# Patient Record
Sex: Female | Born: 1935
Health system: Southern US, Community
[De-identification: ages and names within clinical notes are randomized; demographics above are authoritative.]

## PROBLEM LIST (undated history)

## (undated) DIAGNOSIS — I10 Essential (primary) hypertension: Secondary | ICD-10-CM

## (undated) DIAGNOSIS — G629 Polyneuropathy, unspecified: Secondary | ICD-10-CM

## (undated) DIAGNOSIS — H3530 Unspecified macular degeneration: Secondary | ICD-10-CM

## (undated) DIAGNOSIS — D0512 Intraductal carcinoma in situ of left breast: Secondary | ICD-10-CM

## (undated) DIAGNOSIS — C50919 Malignant neoplasm of unspecified site of unspecified female breast: Secondary | ICD-10-CM

## (undated) DIAGNOSIS — Z872 Personal history of diseases of the skin and subcutaneous tissue: Secondary | ICD-10-CM

## (undated) DIAGNOSIS — T884XXA Failed or difficult intubation, initial encounter: Secondary | ICD-10-CM

## (undated) DIAGNOSIS — E039 Hypothyroidism, unspecified: Secondary | ICD-10-CM

## (undated) HISTORY — DX: Unspecified macular degeneration: H35.30

## (undated) HISTORY — PX: CATARACT EXTRACTION W/ INTRAOCULAR LENS  IMPLANT, BILATERAL: SHX1307

## (undated) HISTORY — PX: PARS PLANA VITRECTOMY W/ REPAIR OF MACULAR HOLE: SHX2170

## (undated) HISTORY — DX: Essential (primary) hypertension: I10

## (undated) HISTORY — DX: Polyneuropathy, unspecified: G62.9

## (undated) HISTORY — PX: BREAST LUMPECTOMY: SHX2

## (undated) HISTORY — PX: ABDOMINAL HYSTERECTOMY: SHX81

---
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
## 1972-02-08 HISTORY — PX: EXCISION MORTON'S NEUROMA: SHX5013

## 1997-06-05 ENCOUNTER — Ambulatory Visit: Admission: RE | Admit: 1997-06-05 | Discharge: 1997-06-05 | Payer: Self-pay | Admitting: Internal Medicine

## 1998-05-28 ENCOUNTER — Ambulatory Visit (HOSPITAL_COMMUNITY): Admission: RE | Admit: 1998-05-28 | Discharge: 1998-05-28 | Payer: Self-pay | Admitting: Internal Medicine

## 1998-05-28 ENCOUNTER — Encounter: Payer: Self-pay | Admitting: Internal Medicine

## 1998-06-08 ENCOUNTER — Ambulatory Visit (HOSPITAL_COMMUNITY): Admission: RE | Admit: 1998-06-08 | Discharge: 1998-06-08 | Payer: Self-pay | Admitting: Family Medicine

## 1998-11-30 ENCOUNTER — Other Ambulatory Visit: Admission: RE | Admit: 1998-11-30 | Discharge: 1998-11-30 | Payer: Self-pay | Admitting: Internal Medicine

## 1999-05-31 ENCOUNTER — Ambulatory Visit (HOSPITAL_COMMUNITY): Admission: RE | Admit: 1999-05-31 | Discharge: 1999-05-31 | Payer: Self-pay | Admitting: Internal Medicine

## 1999-05-31 ENCOUNTER — Encounter: Payer: Self-pay | Admitting: Internal Medicine

## 1999-12-14 ENCOUNTER — Other Ambulatory Visit: Admission: RE | Admit: 1999-12-14 | Discharge: 1999-12-14 | Payer: Self-pay | Admitting: Internal Medicine

## 2000-03-14 ENCOUNTER — Encounter: Admission: RE | Admit: 2000-03-14 | Discharge: 2000-04-24 | Payer: Self-pay | Admitting: Orthopaedic Surgery

## 2000-06-05 ENCOUNTER — Encounter: Payer: Self-pay | Admitting: Internal Medicine

## 2000-06-05 ENCOUNTER — Ambulatory Visit (HOSPITAL_COMMUNITY): Admission: RE | Admit: 2000-06-05 | Discharge: 2000-06-05 | Payer: Self-pay | Admitting: Internal Medicine

## 2001-06-07 ENCOUNTER — Encounter: Payer: Self-pay | Admitting: Internal Medicine

## 2001-06-07 ENCOUNTER — Ambulatory Visit (HOSPITAL_COMMUNITY): Admission: RE | Admit: 2001-06-07 | Discharge: 2001-06-07 | Payer: Self-pay | Admitting: Internal Medicine

## 2001-11-01 ENCOUNTER — Inpatient Hospital Stay (HOSPITAL_COMMUNITY): Admission: EM | Admit: 2001-11-01 | Discharge: 2001-11-08 | Payer: Self-pay | Admitting: Emergency Medicine

## 2001-11-01 ENCOUNTER — Encounter: Payer: Self-pay | Admitting: General Surgery

## 2001-11-01 ENCOUNTER — Encounter: Payer: Self-pay | Admitting: Internal Medicine

## 2001-11-02 ENCOUNTER — Encounter (INDEPENDENT_AMBULATORY_CARE_PROVIDER_SITE_OTHER): Payer: Self-pay | Admitting: *Deleted

## 2001-11-02 HISTORY — PX: CHOLECYSTECTOMY: SHX55

## 2002-06-13 ENCOUNTER — Encounter: Payer: Self-pay | Admitting: Internal Medicine

## 2002-06-13 ENCOUNTER — Ambulatory Visit (HOSPITAL_COMMUNITY): Admission: RE | Admit: 2002-06-13 | Discharge: 2002-06-13 | Payer: Self-pay | Admitting: Internal Medicine

## 2003-02-06 ENCOUNTER — Other Ambulatory Visit: Admission: RE | Admit: 2003-02-06 | Discharge: 2003-02-06 | Payer: Self-pay | Admitting: Internal Medicine

## 2003-06-18 ENCOUNTER — Ambulatory Visit (HOSPITAL_COMMUNITY): Admission: RE | Admit: 2003-06-18 | Discharge: 2003-06-18 | Payer: Self-pay | Admitting: Internal Medicine

## 2004-06-18 ENCOUNTER — Ambulatory Visit (HOSPITAL_COMMUNITY): Admission: RE | Admit: 2004-06-18 | Discharge: 2004-06-18 | Payer: Self-pay | Admitting: Internal Medicine

## 2005-06-20 ENCOUNTER — Ambulatory Visit (HOSPITAL_COMMUNITY): Admission: RE | Admit: 2005-06-20 | Discharge: 2005-06-20 | Payer: Self-pay | Admitting: Internal Medicine

## 2006-02-16 ENCOUNTER — Other Ambulatory Visit: Admission: RE | Admit: 2006-02-16 | Discharge: 2006-02-16 | Payer: Self-pay | Admitting: Internal Medicine

## 2006-04-10 ENCOUNTER — Encounter: Admission: RE | Admit: 2006-04-10 | Discharge: 2006-04-10 | Payer: Self-pay | Admitting: Neurology

## 2006-06-22 ENCOUNTER — Ambulatory Visit (HOSPITAL_COMMUNITY): Admission: RE | Admit: 2006-06-22 | Discharge: 2006-06-22 | Payer: Self-pay | Admitting: Internal Medicine

## 2007-03-19 ENCOUNTER — Encounter: Admission: RE | Admit: 2007-03-19 | Discharge: 2007-03-19 | Payer: Self-pay | Admitting: Internal Medicine

## 2007-04-12 ENCOUNTER — Encounter: Admission: RE | Admit: 2007-04-12 | Discharge: 2007-04-12 | Payer: Self-pay | Admitting: Internal Medicine

## 2007-06-26 ENCOUNTER — Ambulatory Visit (HOSPITAL_COMMUNITY): Admission: RE | Admit: 2007-06-26 | Discharge: 2007-06-26 | Payer: Self-pay | Admitting: Internal Medicine

## 2007-07-05 ENCOUNTER — Encounter: Admission: RE | Admit: 2007-07-05 | Discharge: 2007-07-05 | Payer: Self-pay | Admitting: Internal Medicine

## 2007-10-09 ENCOUNTER — Encounter: Admission: RE | Admit: 2007-10-09 | Discharge: 2007-10-09 | Payer: Self-pay | Admitting: Internal Medicine

## 2008-02-15 ENCOUNTER — Ambulatory Visit: Payer: Self-pay

## 2008-04-07 ENCOUNTER — Ambulatory Visit: Payer: Self-pay | Admitting: Internal Medicine

## 2008-04-07 ENCOUNTER — Encounter: Admission: RE | Admit: 2008-04-07 | Discharge: 2008-04-07 | Payer: Self-pay | Admitting: Internal Medicine

## 2008-06-26 ENCOUNTER — Ambulatory Visit (HOSPITAL_COMMUNITY): Admission: RE | Admit: 2008-06-26 | Discharge: 2008-06-26 | Payer: Self-pay | Admitting: Internal Medicine

## 2008-10-21 ENCOUNTER — Ambulatory Visit: Payer: Self-pay | Admitting: Internal Medicine

## 2009-01-06 ENCOUNTER — Ambulatory Visit (HOSPITAL_COMMUNITY): Admission: RE | Admit: 2009-01-06 | Discharge: 2009-01-06 | Payer: Self-pay | Admitting: Orthopaedic Surgery

## 2009-01-06 HISTORY — PX: KNEE ARTHROSCOPY: SHX127

## 2009-04-09 ENCOUNTER — Other Ambulatory Visit: Admission: RE | Admit: 2009-04-09 | Discharge: 2009-04-09 | Payer: Self-pay | Admitting: Internal Medicine

## 2009-04-09 ENCOUNTER — Ambulatory Visit: Payer: Self-pay | Admitting: Internal Medicine

## 2009-06-29 ENCOUNTER — Ambulatory Visit (HOSPITAL_COMMUNITY): Admission: RE | Admit: 2009-06-29 | Discharge: 2009-06-29 | Payer: Self-pay | Admitting: Internal Medicine

## 2009-06-29 LAB — HM MAMMOGRAPHY

## 2009-10-15 ENCOUNTER — Ambulatory Visit: Payer: Self-pay | Admitting: Internal Medicine

## 2010-02-28 ENCOUNTER — Encounter: Payer: Self-pay | Admitting: Internal Medicine

## 2010-04-15 ENCOUNTER — Ambulatory Visit (INDEPENDENT_AMBULATORY_CARE_PROVIDER_SITE_OTHER): Payer: Medicare Other | Admitting: Internal Medicine

## 2010-04-15 DIAGNOSIS — I10 Essential (primary) hypertension: Secondary | ICD-10-CM

## 2010-04-15 DIAGNOSIS — E039 Hypothyroidism, unspecified: Secondary | ICD-10-CM

## 2010-05-12 LAB — BASIC METABOLIC PANEL
BUN: 6 mg/dL (ref 6–23)
CO2: 29 mEq/L (ref 19–32)
GFR calc non Af Amer: >60 mL/min (ref >60)
Glucose, Bld: 92 mg/dL (ref 70–99)
Sodium: 140 mEq/L (ref 135–145)

## 2010-05-12 LAB — CBC
Hemoglobin: 14 g/dL (ref 12.0–15.0)
MCHC: 34.6 g/dL (ref 30.0–36.0)
MCV: 92.9 fL (ref 78.0–100.0)
Platelets: 193 10*3/uL (ref 150–400)
RBC: 4.34 MIL/uL (ref 3.87–5.11)
RDW: 13.1 % (ref 11.5–15.5)

## 2010-05-24 ENCOUNTER — Other Ambulatory Visit: Payer: Self-pay | Admitting: Internal Medicine

## 2010-05-24 DIAGNOSIS — Z1231 Encounter for screening mammogram for malignant neoplasm of breast: Secondary | ICD-10-CM
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
## 2010-06-25 NOTE — Discharge Summary (Signed)
   [name], Christine Frost                       ACCOUNT NO.:  [id number]   MEDICAL RECORD NO.:  [id number]                   PATIENT TYPE:  INP   LOCATION:  5150                                 FACILITY:  [facility]   PHYSICIAN:  Adolph Pollack, M.D.            DATE OF BIRTH:  16-Dec-1930   DATE OF ADMISSION:  11/01/2001  DATE OF DISCHARGE:  11/08/2001                                 DISCHARGE SUMMARY   PRINCIPAL DISCHARGE DIAGNOSIS:  Acute cholecystitis.   SECONDARY DIAGNOSIS:  Gallbladder empyema.   PROCEDURE:  Laparoscopic cholecystectomy.   REASON FOR ADMISSION:  The patient is a 77 year old female who 5 days prior  to admission had the onset of crampy epigastric pain that persisted and  worsened and became more associated with the right upper quadrant.  She had  some nausea, vomiting and chills and abdominal bloating.  Dr. Lenord Fellers saw her  in the office and palpated a right upper quadrant mass.  She subsequently  was admitted.   HOSPITAL COURSE:  She underwent a CT scan which was consistent with acute  cholecystitis and had elevation of her white blood cell count.  She was  hydrated, started on IV antibiotics and on 11/02/2001 taken to the operating  room where she had severe acute cholecystitis with a gallbladder empyema.  A  Blake drain was left in and she was maintained on IV Unasyn.  She did have a  postoperative ileus which slowly began improving.  She still had some low  grade fever and was kept on her intravenous Unasyn.  She was started on a  clear liquid diet and advanced to full liquid diet.  She began having bowel  movements on the fifth postoperative day and her fever was trending down and  her diet was advanced.  She subsequently became afebrile on the sixth  postoperative day and was tolerating a diet.  Her Jackson-Pratt drain was  removed as the output was not bilious and she was felt to be ready for  discharge.   DISPOSITION:  Discharged to home on 11/08/2001  in satisfactory condition.  She is to take Augmentin q.12h. and Tylenol as needed for pain.  She was  given specific instructions and will see me in about 2 weeks for follow-up.                                               Adolph Pollack, M.D.    Kari Baars  D:  12/11/2001  T:  12/12/2001  Job:  [id number]   cc:   Luanna Cole. Lenord Fellers, M.D.  [address] [place]  [place]  [place] [address]  Fax: [phone]

## 2010-06-25 NOTE — Consult Note (Signed)
NAME:  Christine Frost, Christine Frost                       ACCOUNT NO.:  [id number]   MEDICAL RECORD NO.:  [id number]                   PATIENT TYPE:  INP   LOCATION:  5150                                 FACILITY:  [facility]   PHYSICIAN:  Adolph Pollack, M.D.            DATE OF BIRTH:  04-Jul-1930   DATE OF CONSULTATION:  DATE OF DISCHARGE:                                   CONSULTATION   REASON FOR CONSULTATION:  Right upper quadrant pain, distention.   HISTORY OF PRESENT ILLNESS:  The patient is an otherwise healthy 77 year old  female who  was at the beach and had some hash browns and barbecue. After  that she began developing some epigastric and right upper quadrant abdominal  cramping that persisted. Her daughter states she looked quite ill Saturday  night. She had two episodes of vomiting but the pain persisted. She kept  thinking that this was going to get better. She noticed abdominal distention  and decrease of appetite. She finally went to see Dr. Lenord Fellers, who noted a  right upper quadrant mass. She was sent over to the hospital for admission.   She has been having  some chills, she tells me. She states she has never had  any type of abdominal pain before and has never had food intolerance before.   PAST MEDICAL HISTORY:  No chronic illnesses.   PAST SURGICAL HISTORY:  Transvaginal hysterectomy.   ALLERGIES:  No known allergies.   MEDICATIONS:  1. Multivitamin.  2. Calcium.  3. Vitamin C.   SOCIAL HISTORY:  No tobacco use. She is a retired Technical brewer. She  occasionally has an alcoholic beverage.   REVIEW OF SYSTEMS:  CARDIOVASCULAR:  She denies hypertension or heart  disease. GI:  She denies peptic ulcer disease. She denies any  diverticulitis. She denies any hepatitis or jaundice. She had a colonoscopy  by Dr. Matthias Hughs in March of this year which was unremarkable. GU:  No kidney  stones.  HEMATOLOGIC:  She has had a transfusion before. No reported blood  clots or bleeding  disorders.   PHYSICAL EXAMINATION:  GENERAL:  An ill appearing female with a temperature  of 102. No jaundice on the skin.  HEENT:  Eyes no icterus.  NECK:  Supple without palpable masses.  CARDIOVASCULAR:  Heart demonstrates an increased rate with a regular rhythm.  RESPIRATORY:  Breath sounds equal and clear.  ABDOMEN:  Shows a firm mass in the right upper quadrant area going down to  the right mid abdomen which is tender to palpitation. There is distention  noted. Active bowel sounds are noted as well.   LABORATORY DATA:  Remarkable for WBC count of 15,700, hemoglobin 13.1. Her  alkaline phosphatase is 159; the rest of her liver function tests are not  elevated. Her amylase and lipase are not elevated.   Review of CT scan shows gallbladder wall thickening with pericholecystic  inflammatory changes.   IMPRESSION:  Acute calculus cholecystitis. She has a significant right upper  quadrant inflammatory response, most likely the omentum encasing the  gallbladder.   PLAN:  1. Intravenous fluid hydration.  2. Intravenous antibiotics.  3. Laparoscopic possible open cholecystectomy on November 02, 2001. I did     explain the procedure and the risks to her. The risks including but not     limited to bleeding, infection,  thrombolic injury, bile leak, small     intestinal injury, the risk of anesthesia. We also talked about the fact     that the inflammation could be so significant that we cannot  identify     bile structures and she just may end up with a cholecystostomy tube. Both     she and her daughter seemed to understand this and agreed to proceed.                                               Adolph Pollack, M.D.    Kari Baars  D:  11/01/2001  T:  11/05/2001  Job:  [id number]   cc:   Luanna Cole. Lenord Fellers, M.D.   Florencia Reasons, M.D.  [address] [place]  [place], [place] [address]  Fax: [phone]

## 2010-06-25 NOTE — Op Note (Signed)
NAME:  Christine Frost, Christine Frost                       ACCOUNT NO.:  [id number]   MEDICAL RECORD NO.:  [id number]                   PATIENT TYPE:  INP   LOCATION:  5150                                 FACILITY:  [facility]   PHYSICIAN:  Adolph Pollack, M.D.            DATE OF BIRTH:  Sep 30, 1930   DATE OF PROCEDURE:  11/02/2001  DATE OF DISCHARGE:                                 OPERATIVE REPORT   PREOPERATIVE DIAGNOSIS:  Acute cholecystitis.   POSTOPERATIVE DIAGNOSIS:  Severe acute cholecystitis.   PROCEDURE:  Laparoscopic cholecystectomy.   SURGEON:  Adolph Pollack, M.D.   ASSISTANT:  Sheppard Plumber. Earlene Plater, M.D.   ANESTHESIA:  General.   INDICATIONS:  The patient is a 77 year old female who became ill after  eating hash browns and barbecue six days ago.  She stayed at home and  finally presented to Dr. Lenord Fellers, her primary care physician, who noted her  to be quite ill with fever and inability to keep oral intake.  She also had  a palpable right upper quadrant midabdominal mass.  She was subsequently  admitted to the hospital, where CT scan findings were consistent with severe  acute cholecystitis.  She also had leukocytosis.  She was hydrated  overnight, given intravenous antibiotics, and is now brought to the  operating room.  The procedure and the risks were discussed with her  preoperatively.   DESCRIPTION OF PROCEDURE:  She was placed supine upon the operating table,  and a general anesthetic was administered.  A Foley catheter was placed in  the bladder.  On examination, there was a palpable mass in the right upper  quadrant down to the umbilicus.  The abdomen was sterilely prepped and  draped.  Local anesthetic was infiltrated in the subumbilical region and a  small subumbilical incision made in the skin and subcutaneous tissue.  The  midline fascia was incised and the peritoneal cavity was entered under  direct vision.  A pursestring suture of 0 Vicryl was placed around the  fascial edges.  A Hasson trocar was introduced in the peritoneal cavity and  pneumoperitoneum created with insufflation of CO2 gas.  Next a laparoscope  was introduced, and what I saw was acute inflammatory reaction in the right  upper quadrant along with acute inflammation of the omentum.  I placed an 11  mm trocar in the epigastric region and two 5 mm trocars in the right lower  quadrant region.  I began to peel the omentum off the liver and then noticed  an inflamed gallbladder.  I was able to grasp the fundus of the gallbladder.  There was severe inflammation noted.  Using hydrodissection as well as  careful blunt dissection, I was able to dissect the gallbladder free from  the omentum first by peeling the omentum off the fundus and the body.  A  small puncture hole was made in the gallbladder while doing this, and pus  was evacuated and suctioned out, and irrigation was then performed.  Eventually the fundus of the gallbladder was able to be retracted up toward  the right shoulder.  I was then able to identify the infundibulum using  blunt dissection and keeping the dissection on the gallbladder.  There was a  stone impacted.  We then grasped the infundibulum and mobilized it  laterally.  I completely mobilized the infundibulum using blunt dissection  and was able to identify the cystic duct.  I created a window around the  cystic duct, which was fairly inflamed.  I then also identified a cystic  artery and created a window around it.  I clipped the cystic duct four times  proximally, as it was very thickened, and one time distally, and I divided  it.  I then clipped the cystic artery and divided it.  I was able to dissect  the gallbladder free from the liver bed with the cautery.  The large stone  was dislodged and fell out of the gallbladder, and this was retrieved.  Once  I had removed this from the liver, I placed the gallbladder in the Endopouch  bag.  I then copiously irrigated  out the gallbladder fossa and perihepatic  area with 3.5 L of saline.  I did see what appeared to be some accessory  ducts that may have been ducts of Luschka, and I went ahead and placed clips  on these.  I evacuated as much of the fluid as possible.  I subsequently re-  examined the gallbladder fossa and did not notice any bleeding or bile  leakage.  There was, however, a large raw surface.  I placed Surgicel in the  gallbladder fossa.  I then inserted a 19 Blake drain and placed it into the  gallbladder fossa and had it exit out the lateral 5 mm trocar site.  The  wound was anchored to the skin with 3-0 nylon suture.  I reinspected the  position of the drain, and it was adequately in the gallbladder fossa.   Next I examined the omental area and evacuated some of the blood that had  been clotted there.  We then tried to evacuate again as much fluid as  possible.  I then removed the subumbilical trocar and closed the  subumbilical fascial defect by tightening up and tying down the pursestring  suture under laparoscopic vision.  The remaining trocars were removed, and  the pneumoperitoneum was released.   The skin incisions were closed with 4-0 Monocryl subcuticular stitches.  Steri-Strips and sterile dressings were applied.   She tolerated the procedure well without any apparent complications and was  taken to the recovery room in satisfactory condition.  I did explain to her  daughter that because of the severe inflammation and infection that she may  be at slightly high risk of developing a postoperative abscess and we would  keep her on intravenous antibiotics over the weekend at least.                                               Adolph Pollack, M.D.    Kari Baars  D:  11/02/2001  T:  11/05/2001  Job:  [id number]   cc:   Luanna Cole. Lenord Fellers, M.D.

## 2010-07-01 ENCOUNTER — Ambulatory Visit (HOSPITAL_COMMUNITY)
Admission: RE | Admit: 2010-07-01 | Discharge: 2010-07-01 | Disposition: A | Discharge status: Home / Self Care | Payer: Medicare Other | Source: Ambulatory Visit | Attending: Internal Medicine | Admitting: Internal Medicine

## 2010-07-01 DIAGNOSIS — Z1231 Encounter for screening mammogram for malignant neoplasm of breast: Secondary | ICD-10-CM

## 2010-07-08 ENCOUNTER — Other Ambulatory Visit: Payer: Self-pay | Admitting: Internal Medicine

## 2010-10-15 ENCOUNTER — Encounter: Payer: Self-pay | Admitting: Internal Medicine

## 2010-10-18 ENCOUNTER — Encounter: Payer: Self-pay | Admitting: Internal Medicine

## 2010-10-18 ENCOUNTER — Ambulatory Visit (INDEPENDENT_AMBULATORY_CARE_PROVIDER_SITE_OTHER): Payer: Medicare Other | Admitting: Internal Medicine

## 2010-10-18 VITALS — BP 128/62 | HR 66 | Temp 97.6°F | Ht 62.0 in | Wt 122.0 lb

## 2010-10-18 DIAGNOSIS — E039 Hypothyroidism, unspecified: Secondary | ICD-10-CM | POA: Insufficient documentation

## 2010-10-18 DIAGNOSIS — G629 Polyneuropathy, unspecified: Secondary | ICD-10-CM

## 2010-10-18 DIAGNOSIS — E785 Hyperlipidemia, unspecified: Secondary | ICD-10-CM | POA: Insufficient documentation

## 2010-10-18 DIAGNOSIS — I10 Essential (primary) hypertension: Secondary | ICD-10-CM

## 2010-10-18 DIAGNOSIS — G609 Hereditary and idiopathic neuropathy, unspecified: Secondary | ICD-10-CM

## 2010-10-18 NOTE — Progress Notes (Signed)
  Subjective:    Patient ID: Christine Frost, female    DOB: 05-Nov-1930, 77 y.o.   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  Is intolerant of Augmentin causes a rash  Patient had mammogram may 2011, declines flu and Pneumovax immunizations. Last tetanus immunization 2007. Colonoscopy 2003 by Dr. Matthias Hughs. Pilonidal cystectomy 1953, vaginal hysterectomy without oophorectomy for menorrhagia in 1974 bilateral Morton's neuromas excised from feet 1974 left cataract extraction June 1998 left macular hole surgery July 1998; left macular hole reclosed November 1998, laparoscopic cholecystectomy September 2003.  Patient is a widow. Lives alone. Continues to drive.    Review of Systems     Objective:   Physical Exam no thyromegaly; no carotid bruits; chest clear; cardiac exam regular rate and rhythm normal S1 and S2; extremities without edema        Assessment & Plan:  Hypertension  Hyperlipidemia  Peripheral neuropathy of unknown cause seen by Dr.Love  Hypothyroidism  Patient says she feels well and doesn't see the need come every 6 months. Return in one year or as needed. Okay to refill Synthroid for one year. TSH drawn today

## 2010-10-19 ENCOUNTER — Encounter: Payer: Self-pay | Admitting: Internal Medicine

## 2011-01-24 ENCOUNTER — Other Ambulatory Visit: Payer: Self-pay | Admitting: Internal Medicine

## 2011-05-04 DIAGNOSIS — H35349 Macular cyst, hole, or pseudohole, unspecified eye: Secondary | ICD-10-CM | POA: Diagnosis not present

## 2011-05-12 DIAGNOSIS — H35349 Macular cyst, hole, or pseudohole, unspecified eye: Secondary | ICD-10-CM | POA: Diagnosis not present

## 2011-05-12 DIAGNOSIS — H40019 Open angle with borderline findings, low risk, unspecified eye: Secondary | ICD-10-CM | POA: Diagnosis not present

## 2011-05-12 DIAGNOSIS — H353190 Nonexudative age-related macular degeneration, unspecified eye, stage unspecified: Secondary | ICD-10-CM | POA: Diagnosis not present

## 2011-05-30 ENCOUNTER — Other Ambulatory Visit: Payer: Self-pay | Admitting: Internal Medicine

## 2011-05-30 DIAGNOSIS — Z1231 Encounter for screening mammogram for malignant neoplasm of breast: Secondary | ICD-10-CM

## 2011-06-01 ENCOUNTER — Other Ambulatory Visit: Payer: Self-pay

## 2011-06-01 MED ORDER — LEVOTHYROXINE SODIUM 50 MCG PO TABS: 50.0000 ug | ORAL_TABLET | Freq: Every day | ORAL | Status: DC

## 2011-07-07 ENCOUNTER — Ambulatory Visit (HOSPITAL_COMMUNITY)
Admission: RE | Admit: 2011-07-07 | Discharge: 2011-07-07 | Disposition: A | Discharge status: Home / Self Care | Payer: Medicare Other | Source: Ambulatory Visit | Attending: Internal Medicine | Admitting: Internal Medicine

## 2011-07-07 DIAGNOSIS — Z1231 Encounter for screening mammogram for malignant neoplasm of breast: Secondary | ICD-10-CM | POA: Insufficient documentation

## 2011-08-08 ENCOUNTER — Other Ambulatory Visit: Payer: Self-pay | Admitting: Internal Medicine

## 2011-08-31 ENCOUNTER — Other Ambulatory Visit: Payer: Self-pay | Admitting: Gastroenterology

## 2011-08-31 DIAGNOSIS — D126 Benign neoplasm of colon, unspecified: Secondary | ICD-10-CM | POA: Diagnosis not present

## 2011-08-31 DIAGNOSIS — K5730 Diverticulosis of large intestine without perforation or abscess without bleeding: Secondary | ICD-10-CM | POA: Diagnosis not present

## 2011-08-31 DIAGNOSIS — Z1211 Encounter for screening for malignant neoplasm of colon: Secondary | ICD-10-CM | POA: Diagnosis not present

## 2011-10-17 ENCOUNTER — Other Ambulatory Visit: Payer: Medicare Other | Admitting: Internal Medicine

## 2011-10-17 DIAGNOSIS — M810 Age-related osteoporosis without current pathological fracture: Secondary | ICD-10-CM

## 2011-10-17 DIAGNOSIS — M1990 Unspecified osteoarthritis, unspecified site: Secondary | ICD-10-CM | POA: Diagnosis not present

## 2011-10-17 DIAGNOSIS — E039 Hypothyroidism, unspecified: Secondary | ICD-10-CM

## 2011-10-17 DIAGNOSIS — E785 Hyperlipidemia, unspecified: Secondary | ICD-10-CM

## 2011-10-17 DIAGNOSIS — I10 Essential (primary) hypertension: Secondary | ICD-10-CM

## 2011-10-17 DIAGNOSIS — G609 Hereditary and idiopathic neuropathy, unspecified: Secondary | ICD-10-CM | POA: Diagnosis not present

## 2011-10-17 LAB — LIPID PANEL
HDL: 75 mg/dL (ref >39)
Triglycerides: 73 mg/dL (ref <150)

## 2011-10-17 LAB — CBC WITH DIFFERENTIAL/PLATELET
Eosinophils Absolute: 0.2 10*3/uL (ref 0.0–0.7)
Eosinophils Relative: 3 % (ref 0–5)
HCT: 41.7 % (ref 36.0–46.0)
Monocytes Absolute: 0.3 10*3/uL (ref 0.1–1.0)
Platelets: 266 10*3/uL (ref 150–400)
RDW: 14 % (ref 11.5–15.5)
WBC: 6.2 10*3/uL (ref 4.0–10.5)

## 2011-10-17 LAB — COMPREHENSIVE METABOLIC PANEL
Albumin: 4.2 g/dL (ref 3.5–5.2)
BUN: 14 mg/dL (ref 6–23)
CO2: 29 mEq/L (ref 19–32)
Calcium: 9.9 mg/dL (ref 8.4–10.5)
Chloride: 106 mEq/L (ref 96–112)
Glucose, Bld: 89 mg/dL (ref 70–99)
Potassium: 4.8 mEq/L (ref 3.5–5.3)

## 2011-10-18 ENCOUNTER — Ambulatory Visit (INDEPENDENT_AMBULATORY_CARE_PROVIDER_SITE_OTHER): Payer: Medicare Other | Admitting: Internal Medicine

## 2011-10-18 ENCOUNTER — Encounter: Payer: Self-pay | Admitting: Internal Medicine

## 2011-10-18 VITALS — BP 134/62 | HR 92 | Temp 98.5°F | Ht 62.75 in | Wt 121.0 lb

## 2011-10-18 DIAGNOSIS — E039 Hypothyroidism, unspecified: Secondary | ICD-10-CM | POA: Diagnosis not present

## 2011-10-18 DIAGNOSIS — Z0000 Encounter for general adult medical examination without abnormal findings: Secondary | ICD-10-CM | POA: Diagnosis not present

## 2011-10-18 DIAGNOSIS — I10 Essential (primary) hypertension: Secondary | ICD-10-CM

## 2011-10-18 LAB — POCT URINALYSIS DIPSTICK
Glucose, UA: NEGATIVE
Ketones, UA: NEGATIVE
Leukocytes, UA: NEGATIVE
Spec Grav, UA: 1.015
Urobilinogen, UA: NEGATIVE

## 2011-12-09 ENCOUNTER — Other Ambulatory Visit: Payer: Self-pay | Admitting: Internal Medicine

## 2012-01-07 ENCOUNTER — Encounter: Payer: Self-pay | Admitting: Internal Medicine
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
## 2012-01-16 NOTE — Patient Instructions (Addendum)
Medications and return in 6 months

## 2012-02-10 ENCOUNTER — Other Ambulatory Visit: Payer: Self-pay | Admitting: Internal Medicine

## 2012-03-13 ENCOUNTER — Other Ambulatory Visit: Payer: Self-pay | Admitting: Internal Medicine

## 2012-04-11 ENCOUNTER — Other Ambulatory Visit: Payer: Self-pay | Admitting: Internal Medicine

## 2012-04-13 ENCOUNTER — Ambulatory Visit: Payer: Medicare Other | Admitting: Internal Medicine

## 2012-04-20 ENCOUNTER — Encounter: Payer: Self-pay | Admitting: Internal Medicine

## 2012-04-20 ENCOUNTER — Ambulatory Visit: Payer: Medicare Other | Admitting: Internal Medicine

## 2012-04-20 VITALS — BP 128/74 | HR 72 | Wt 124.5 lb

## 2012-04-20 DIAGNOSIS — I10 Essential (primary) hypertension: Secondary | ICD-10-CM

## 2012-04-20 DIAGNOSIS — E039 Hypothyroidism, unspecified: Secondary | ICD-10-CM | POA: Diagnosis not present

## 2012-04-20 DIAGNOSIS — G609 Hereditary and idiopathic neuropathy, unspecified: Secondary | ICD-10-CM

## 2012-04-20 DIAGNOSIS — G9009 Other idiopathic peripheral autonomic neuropathy: Secondary | ICD-10-CM

## 2012-04-21 NOTE — Patient Instructions (Addendum)
Continue same medications and return in 6 months 
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
## 2012-05-03 DIAGNOSIS — H35349 Macular cyst, hole, or pseudohole, unspecified eye: Secondary | ICD-10-CM | POA: Diagnosis not present

## 2012-05-29 ENCOUNTER — Telehealth: Payer: Self-pay | Admitting: *Deleted

## 2012-05-29 ENCOUNTER — Other Ambulatory Visit: Payer: Self-pay | Admitting: Internal Medicine

## 2012-05-29 DIAGNOSIS — Z1231 Encounter for screening mammogram for malignant neoplasm of breast: Secondary | ICD-10-CM

## 2012-05-29 NOTE — Telephone Encounter (Signed)
Message copied by Monico Blitz on Tue May 29, 2012 12:28 PM ------      Message from: Richrd Prime      Created: Tue May 29, 2012 10:45 AM      Contact: patient       Patient called stating she would like to try and get in asap to see someone...she states she is a former patient of Dr. Imagene Gurney.  Her symptoms are:  Numbness in B legs from the knees down, her balance is "off".  She wold like a call back asap.   ------

## 2012-05-29 NOTE — Telephone Encounter (Signed)
Called patient to confirm appt with Dr Marjory Lies for 6 mos f/u

## 2012-06-15 ENCOUNTER — Other Ambulatory Visit: Payer: Self-pay | Admitting: Internal Medicine

## 2012-07-09 ENCOUNTER — Ambulatory Visit (HOSPITAL_COMMUNITY)
Admission: RE | Admit: 2012-07-09 | Discharge: 2012-07-09 | Disposition: A | Discharge status: Home / Self Care | Payer: Medicare Other | Source: Ambulatory Visit | Attending: Internal Medicine | Admitting: Internal Medicine

## 2012-07-09 DIAGNOSIS — Z1231 Encounter for screening mammogram for malignant neoplasm of breast: Secondary | ICD-10-CM | POA: Insufficient documentation

## 2012-10-15 ENCOUNTER — Other Ambulatory Visit: Payer: Medicare Other | Admitting: Internal Medicine

## 2012-10-15 DIAGNOSIS — I10 Essential (primary) hypertension: Secondary | ICD-10-CM | POA: Diagnosis not present

## 2012-10-15 DIAGNOSIS — E039 Hypothyroidism, unspecified: Secondary | ICD-10-CM

## 2012-10-15 DIAGNOSIS — Z130 Encounter for screening for diseases of the blood and blood-forming organs and certain disorders involving the immune mechanism: Secondary | ICD-10-CM

## 2012-10-15 DIAGNOSIS — E785 Hyperlipidemia, unspecified: Secondary | ICD-10-CM

## 2012-10-15 LAB — COMPREHENSIVE METABOLIC PANEL
ALT: 19 U/L (ref 0–35)
Albumin: 4.6 g/dL (ref 3.5–5.2)
CO2: 29 mEq/L (ref 19–32)
Calcium: 9.6 mg/dL (ref 8.4–10.5)
Chloride: 107 mEq/L (ref 96–112)
Glucose, Bld: 85 mg/dL (ref 70–99)
Potassium: 4.2 mEq/L (ref 3.5–5.3)
Sodium: 142 mEq/L (ref 135–145)
Total Bilirubin: 0.6 mg/dL (ref 0.3–1.2)
Total Protein: 6.5 g/dL (ref 6.0–8.3)

## 2012-10-15 LAB — LIPID PANEL
Cholesterol: 189 mg/dL (ref 0–200)
VLDL: 22 mg/dL (ref 0–40)

## 2012-10-15 LAB — CBC WITH DIFFERENTIAL/PLATELET
Hemoglobin: 14.3 g/dL (ref 12.0–15.0)
Lymphocytes Relative: 24 % (ref 12–46)
Lymphs Abs: 1.2 10*3/uL (ref 0.7–4.0)
Monocytes Relative: 8 % (ref 3–12)
Neutro Abs: 3.2 10*3/uL (ref 1.7–7.7)
Neutrophils Relative %: 65 % (ref 43–77)
Platelets: 203 10*3/uL (ref 150–400)
RBC: 4.45 MIL/uL (ref 3.87–5.11)
WBC: 4.9 10*3/uL (ref 4.0–10.5)

## 2012-10-16 LAB — VITAMIN D 25 HYDROXY (VIT D DEFICIENCY, FRACTURES): Vit D, 25-Hydroxy: 52 ng/mL (ref 30–89)

## 2012-10-18 ENCOUNTER — Other Ambulatory Visit: Payer: Self-pay | Admitting: Internal Medicine

## 2012-10-19 ENCOUNTER — Ambulatory Visit (INDEPENDENT_AMBULATORY_CARE_PROVIDER_SITE_OTHER): Payer: Medicare Other | Admitting: Internal Medicine

## 2012-10-19 ENCOUNTER — Encounter: Payer: Self-pay | Admitting: Internal Medicine

## 2012-10-19 VITALS — BP 118/58 | HR 64 | Temp 98.3°F | Resp 18 | Wt 120.0 lb

## 2012-10-19 DIAGNOSIS — E039 Hypothyroidism, unspecified: Secondary | ICD-10-CM | POA: Diagnosis not present

## 2012-10-19 DIAGNOSIS — Z0000 Encounter for general adult medical examination without abnormal findings: Secondary | ICD-10-CM | POA: Diagnosis not present

## 2012-10-19 DIAGNOSIS — I10 Essential (primary) hypertension: Secondary | ICD-10-CM | POA: Diagnosis not present

## 2012-10-19 DIAGNOSIS — G609 Hereditary and idiopathic neuropathy, unspecified: Secondary | ICD-10-CM | POA: Diagnosis not present

## 2012-10-19 LAB — POCT URINALYSIS DIPSTICK
Nitrite, UA: NEGATIVE
Urobilinogen, UA: 0.2
pH, UA: 7.5
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
## 2012-10-23 ENCOUNTER — Other Ambulatory Visit: Payer: Self-pay | Admitting: Internal Medicine

## 2012-10-24 ENCOUNTER — Encounter: Payer: Self-pay | Admitting: Diagnostic Neuroimaging

## 2012-11-06 ENCOUNTER — Telehealth: Payer: Self-pay | Admitting: Internal Medicine

## 2012-11-06 NOTE — Telephone Encounter (Signed)
Verbal per Dr. Lenord Fellers; call in Lotrisone Cream, 30 grams bid, 1 refill.  Advised it will take some time to clear up.    Spoke with patient and advised calling in cream.  Pharmacy:  [facility] @ [address] [phone]); [facility] for pharmacist.

## 2012-11-15 DIAGNOSIS — H35349 Macular cyst, hole, or pseudohole, unspecified eye: Secondary | ICD-10-CM | POA: Diagnosis not present

## 2012-11-15 DIAGNOSIS — H353190 Nonexudative age-related macular degeneration, unspecified eye, stage unspecified: Secondary | ICD-10-CM | POA: Diagnosis not present

## 2012-11-15 DIAGNOSIS — H40019 Open angle with borderline findings, low risk, unspecified eye: Secondary | ICD-10-CM | POA: Diagnosis not present

## 2012-11-19 DIAGNOSIS — H35349 Macular cyst, hole, or pseudohole, unspecified eye: Secondary | ICD-10-CM | POA: Diagnosis not present

## 2012-12-18 ENCOUNTER — Ambulatory Visit (INDEPENDENT_AMBULATORY_CARE_PROVIDER_SITE_OTHER): Payer: Medicare Other | Admitting: Diagnostic Neuroimaging

## 2012-12-18 ENCOUNTER — Ambulatory Visit: Payer: Self-pay | Admitting: Diagnostic Neuroimaging

## 2012-12-18 ENCOUNTER — Encounter: Payer: Self-pay | Admitting: Diagnostic Neuroimaging

## 2012-12-18 ENCOUNTER — Encounter (INDEPENDENT_AMBULATORY_CARE_PROVIDER_SITE_OTHER): Payer: Self-pay

## 2012-12-18 VITALS — BP 140/68 | HR 71 | Temp 98.0°F | Ht 63.0 in | Wt 123.5 lb

## 2012-12-18 DIAGNOSIS — G609 Hereditary and idiopathic neuropathy, unspecified: Secondary | ICD-10-CM

## 2012-12-18 NOTE — Patient Instructions (Signed)
Try physical therapy

## 2012-12-18 NOTE — Progress Notes (Signed)
[facility]  PATIENT: Christine Frost DOB: 1930/05/02  REFERRING CLINICIAN:  HISTORY FROM: patient  REASON FOR VISIT: follow up   HISTORICAL  CHIEF COMPLAINT:  Chief Complaint  Patient presents with  . Follow-up    prior Dr. Sandria Manly patient, hx of numbness    HISTORY OF PRESENT ILLNESS:   UPDATE 12/19/12: Patient presents for followup. She's had neuropathy for past 2 years. She feels like it is progressing. She denies any pain. She just has lack of sensation and poor balance. Numbness involves her feet, toes, ankles, up to her mid shins.  UPDATE 10/17/11: Patient returns for followup. She feels her numb feet are about the same but she feels more off balance at times. She does not use an assistive device but does have a cane. She has not fallen; she claims none of her activities have been curtailed. She remains independent in all activities of daily living. She mows her yard and works in her flowers. She does not have pain. She had not had issues with driving. No problems with bowel or bladder function  UPDATE 10/19/10: She feels her feet remain   numb  but there is not a marked difference. She does not have numbness in her hands. Her bowel and bladder function are normal. She feels the  progression of this disorder is very slow.She has an elevator to her first  floor at [employer]. She is independent in activities of daily living. She enjoys being in the yard. She has no difficulty driving. She denies pain.  She does not use a walker or a cane.No falls, no problems climbing a ladder. No new neurologic complaints. See ROS  PRIOR HPI: 22 year old right-handed white widowed female with a 7 year history of numbness in her feet secondary to an  axonal peripheral neuropathy, causing  gait disorder. She developed pain in November 2009 in her left Achilles region that I thought maybe a tendinitis.  Doppler study of the venous system 02/15/2008 was unremarkable for evidence of  phlebitis. She was seen by Dr. Lorenda Peck and felt to have an Achilles tear and as treated with physical therapy. Her symptoms improved. She fell when her neighbors dog jumped on her 06/13/2008 fracturing her right ankle at the distal fibula. This was a clean break and she was placed in a cam boot. She had good recovery from her right ankle fracture. In the fall 2010 she noticed that her left knee was swollen and painful wthout injury. She had a lateral and medial meniscus tear and underwent surgery 01/06/2009. She notices symptoms when sitting and getting up. She did her rehabilitation at home. A walker was prescribed but she did not need one.   REVIEW OF SYSTEMS: Full 14 system review of systems performed and notable only for numbness.  ALLERGIES: Allergies  Allergen Reactions  . Penicillins   . Pred Forte [Prednisolone Acetate]   . Amoxicillin-Pot Clavulanate Rash    HOME MEDICATIONS: Outpatient Prescriptions Prior to Visit  Medication Sig Dispense Refill  . aspirin 81 MG tablet Take 81 mg by mouth daily.        . Cyanocobalamin 1000 MCG CAPS Take by mouth daily.      Marland Kitchen losartan (COZAAR) 50 MG tablet TAKE 1 TABLET BY MOUTH DAILY  30 tablet  6  . Multiple Vitamins-Minerals (CENTRUM SILVER PO) Take by mouth.        . SYNTHROID 50 MCG tablet TAKE 1 TABLET BY MOUTH EVERY DAY  30 tablet  11  . CINNAMON PO Take by mouth.        . fish oil-omega-3 fatty acids 1000 MG capsule Take 2 g by mouth daily.       Marland Kitchen glucosamine-chondroitin 500-400 MG tablet Take 1 tablet by mouth 3 (three) times daily.       No facility-administered medications prior to visit.    PAST MEDICAL HISTORY: Past Medical History  Diagnosis Date  . Thyroid disease   . Hyperlipidemia   . Hypertension   . Peripheral neuropathy   . Right fibular fracture   . Macular degeneration   . Peripheral axonal neuropathy   . Tear of tendon of left ankle     PAST SURGICAL HISTORY: Past Surgical History  Procedure Laterality  Date  . Abdominal hysterectomy    . Eye surgery      Righ cataract  . Pars plana vitrectomy w/ repair of macular hole  7/98 & 11/98    Left  . Excision morton's neuroma  1974    bilateral  . Cholecystectomy    . Pilonidal cystectomy  1953    FAMILY HISTORY: Family History  Problem Relation Age of Onset  . Heart disease Mother   . Stroke Mother   . Alcohol abuse Father   . Heart disease Brother     SOCIAL HISTORY:  History   Social History  . Marital Status: Widowed    Spouse Name: N/A    Number of Children: 2  . Years of Education: college   Occupational History  . retired     Charity fundraiser   Social History Main Topics  . Smoking status: Never Smoker   . Smokeless tobacco: Never Used  . Alcohol Use: Yes     Comment: Red Wine 1 glass 5 nights a week  . Drug Use: No  . Sexual Activity: Not on file   Other Topics Concern  . Not on file   Social History Narrative   Patient lives at home alone.   Caffeine Use: 4-5 cups daily     PHYSICAL EXAM  Filed Vitals:   12/18/12 1201  BP: 140/68  Pulse: 71  Temp: 98 F (36.7 C)  TempSrc: Oral  Height: 5\' 3"  (1.6 m)  Weight: 123 lb 8 oz (56.019 kg)    Not recorded    Body mass index is 21.88 kg/(m^2).  GENERAL EXAM: Patient is in no distress  CARDIOVASCULAR: Regular rate and rhythm, no murmurs, no carotid bruits  NEUROLOGIC: MENTAL STATUS: awake, alert, language fluent, comprehension intact, naming intact CRANIAL NERVE: no papilledema on fundoscopic exam, pupils equal and reactive to light, visual fields full to confrontation, extraocular muscles intact, no nystagmus, facial sensation and strength symmetric, uvula midline, shoulder shrug symmetric, tongue midline. MOTOR: normal bulk and tone, full strength in the BUE; BLE (PROX 5, DF 3, PF 3). SENSORY: VIB 8 SEC AT TOES. DECR PP IN FEET / ANKLES. COORDINATION: finger-nose-finger, fine finger movements normal REFLEXES: BUE 1, KNEES TRACE, ANKLES 0 GAIT/STATION:  WIDE BASED STEPPAGE GAIT WITH FOOT SLAPPING BILATERALLY. CANNOT STAND ON TOES OR HEELS. DIFF STANDING FEET TOGETHER EYES OPEN. EVENTUALLY ABLE TO STAND FEET TOGETHER, AND THEN EYES CLOSED.   DIAGNOSTIC DATA (LABS, IMAGING, TESTING) - I reviewed patient records, labs, notes, testing and imaging myself where available.  Lab Results  Component Value Date   WBC 4.9 10/15/2012   HGB 14.3 10/15/2012   HCT 39.9 10/15/2012   MCV 89.7 10/15/2012   PLT 203 10/15/2012  Component Value Date/Time   NA 142 10/15/2012 0925   K 4.2 10/15/2012 0925   CL 107 10/15/2012 0925   CO2 29 10/15/2012 0925   GLUCOSE 85 10/15/2012 0925   BUN 12 10/15/2012 0925   CREATININE 0.67 10/15/2012 0925   CREATININE 0.69 01/02/2009 0906   CALCIUM 9.6 10/15/2012 0925   PROT 6.5 10/15/2012 0925   ALBUMIN 4.6 10/15/2012 0925   AST 19 10/15/2012 0925   ALT 19 10/15/2012 0925   ALKPHOS 47 10/15/2012 0925   BILITOT 0.6 10/15/2012 0925   GFRNONAA >60 01/02/2009 0906   GFRAA  Value: >60        The eGFR has been calculated using the MDRD equation. This calculation has not been validated in all clinical situations. eGFR's persistently <60 mL/min signify possible Chronic Kidney Disease. 01/02/2009 0906   Lab Results  Component Value Date   CHOL 189 10/15/2012   HDL 72 10/15/2012   LDLCALC 95 10/15/2012   TRIG 109 10/15/2012   CHOLHDL 2.6 10/15/2012   No results found for this basename: HGBA1C   No results found for this basename: VITAMINB12   Lab Results  Component Value Date   TSH 1.554 10/15/2012      ASSESSMENT AND PLAN  79 y.o. year old female here with idiopathic neuropathy. Continues to have problems with balance and gait difficulty. No pain fortunately.  Dx: idiopathic neuropathy  PLAN: - Try physical therapy   Orders Placed This Encounter  Procedures  . Ambulatory referral to Physical Therapy   Return in about 1 year (around 12/18/2013) for with Edison Nasuti, MD 12/18/2012, 12:46 PM Certified in Neurology,  Neurophysiology and Neuroimaging  [facility] [address] [place], [place] [address] [phone]

## 2013-01-17 ENCOUNTER — Ambulatory Visit: Payer: Medicare Other | Attending: Diagnostic Neuroimaging | Admitting: Physical Therapy

## 2013-01-17 DIAGNOSIS — IMO0001 Reserved for inherently not codable concepts without codable children: Secondary | ICD-10-CM | POA: Diagnosis not present

## 2013-01-17 DIAGNOSIS — M6281 Muscle weakness (generalized): Secondary | ICD-10-CM | POA: Insufficient documentation

## 2013-01-17 DIAGNOSIS — R269 Unspecified abnormalities of gait and mobility: Secondary | ICD-10-CM | POA: Diagnosis not present

## 2013-01-17 DIAGNOSIS — R5381 Other malaise: Secondary | ICD-10-CM | POA: Diagnosis not present

## 2013-02-12 ENCOUNTER — Ambulatory Visit: Payer: Medicare Other | Attending: Diagnostic Neuroimaging | Admitting: Physical Therapy

## 2013-02-12 DIAGNOSIS — M6281 Muscle weakness (generalized): Secondary | ICD-10-CM | POA: Diagnosis not present

## 2013-02-12 DIAGNOSIS — IMO0001 Reserved for inherently not codable concepts without codable children: Secondary | ICD-10-CM | POA: Insufficient documentation

## 2013-02-12 DIAGNOSIS — R269 Unspecified abnormalities of gait and mobility: Secondary | ICD-10-CM | POA: Insufficient documentation

## 2013-02-12 DIAGNOSIS — R5381 Other malaise: Secondary | ICD-10-CM | POA: Insufficient documentation

## 2013-02-14 ENCOUNTER — Ambulatory Visit: Payer: Medicare Other | Admitting: Physical Therapy

## 2013-02-19 ENCOUNTER — Ambulatory Visit: Payer: Medicare Other | Admitting: Physical Therapy

## 2013-02-21 ENCOUNTER — Ambulatory Visit: Payer: Medicare Other | Admitting: Physical Therapy

## 2013-02-26 ENCOUNTER — Ambulatory Visit: Payer: Medicare Other | Admitting: Physical Therapy

## 2013-02-28 ENCOUNTER — Ambulatory Visit: Payer: Medicare Other | Admitting: Physical Therapy

## 2013-03-05 ENCOUNTER — Ambulatory Visit: Payer: Medicare Other | Admitting: Physical Therapy

## 2013-03-07 ENCOUNTER — Ambulatory Visit: Payer: Medicare Other | Admitting: Physical Therapy

## 2013-03-12 ENCOUNTER — Ambulatory Visit: Payer: Medicare Other | Attending: Diagnostic Neuroimaging | Admitting: Physical Therapy

## 2013-03-12 DIAGNOSIS — R5381 Other malaise: Secondary | ICD-10-CM | POA: Diagnosis not present

## 2013-03-12 DIAGNOSIS — M6281 Muscle weakness (generalized): Secondary | ICD-10-CM | POA: Diagnosis not present

## 2013-03-12 DIAGNOSIS — IMO0001 Reserved for inherently not codable concepts without codable children: Secondary | ICD-10-CM | POA: Insufficient documentation

## 2013-03-12 DIAGNOSIS — R269 Unspecified abnormalities of gait and mobility: Secondary | ICD-10-CM | POA: Insufficient documentation

## 2013-03-14 ENCOUNTER — Ambulatory Visit: Payer: Medicare Other | Admitting: Physical Therapy

## 2013-04-01 NOTE — Patient Instructions (Signed)
Continue same medications and return in 6 months 

## 2013-04-25 ENCOUNTER — Encounter: Payer: Self-pay | Admitting: Internal Medicine

## 2013-04-25 ENCOUNTER — Ambulatory Visit (INDEPENDENT_AMBULATORY_CARE_PROVIDER_SITE_OTHER): Payer: Medicare Other | Admitting: Internal Medicine

## 2013-04-25 VITALS — BP 132/64 | HR 68 | Temp 97.8°F | Wt 123.0 lb

## 2013-04-25 DIAGNOSIS — E039 Hypothyroidism, unspecified: Secondary | ICD-10-CM | POA: Diagnosis not present

## 2013-04-25 LAB — TSH: TSH: 2.212 u[IU]/mL (ref 0.350–4.500)

## 2013-04-25 NOTE — Patient Instructions (Signed)
Continue same medications and return in 6 months 

## 2013-04-25 NOTE — Progress Notes (Signed)
   Subjective:    Patient ID: Christine Frost, female    DOB: 12-03-1930, 80 y.o.   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 Blood pressure is excellent on medication. TSH is drawn and pending. She feels well and remains active. History of peripheral neuropathy idiopathic followed by a neurologist.    Review of Systems     Objective:   Physical Exam  Neck is supple without thyromegaly JVD or carotid bruits. Chest clear to auscultation. Cardiac exam regular rate and rhythm normal S1 and S2. Extremities without edema      Assessment & Plan:  Hypertension  Hypothyroidism  History of peripheral neuropathy-stable no worse. She went to physical therapy on advice of neurologist but it did not seem to help very much  Plan: Return in 6 months for physical exam. Continue same medications. TSH is pending.

## 2013-06-04 ENCOUNTER — Other Ambulatory Visit: Payer: Self-pay | Admitting: Internal Medicine

## 2013-06-18 ENCOUNTER — Other Ambulatory Visit: Payer: Self-pay | Admitting: Internal Medicine

## 2013-06-18 DIAGNOSIS — Z1231 Encounter for screening mammogram for malignant neoplasm of breast: Secondary | ICD-10-CM

## 2013-07-10 ENCOUNTER — Ambulatory Visit (HOSPITAL_COMMUNITY)
Admission: RE | Admit: 2013-07-10 | Discharge: 2013-07-10 | Disposition: A | Discharge status: Home / Self Care | Payer: Medicare Other | Source: Ambulatory Visit | Attending: Internal Medicine | Admitting: Internal Medicine

## 2013-07-10 ENCOUNTER — Other Ambulatory Visit: Payer: Self-pay | Admitting: Internal Medicine

## 2013-07-10 DIAGNOSIS — Z1231 Encounter for screening mammogram for malignant neoplasm of breast: Secondary | ICD-10-CM | POA: Insufficient documentation

## 2013-08-20 DIAGNOSIS — H35349 Macular cyst, hole, or pseudohole, unspecified eye: Secondary | ICD-10-CM | POA: Diagnosis not present

## 2013-08-20 DIAGNOSIS — H40019 Open angle with borderline findings, low risk, unspecified eye: Secondary | ICD-10-CM | POA: Diagnosis not present

## 2013-09-23 DIAGNOSIS — H40019 Open angle with borderline findings, low risk, unspecified eye: Secondary | ICD-10-CM | POA: Diagnosis not present

## 2013-10-28 ENCOUNTER — Other Ambulatory Visit: Payer: Medicare Other | Admitting: Internal Medicine

## 2013-10-28 DIAGNOSIS — Z0000 Encounter for general adult medical examination without abnormal findings: Secondary | ICD-10-CM

## 2013-10-28 DIAGNOSIS — I10 Essential (primary) hypertension: Secondary | ICD-10-CM

## 2013-10-28 DIAGNOSIS — E785 Hyperlipidemia, unspecified: Secondary | ICD-10-CM

## 2013-10-28 DIAGNOSIS — E039 Hypothyroidism, unspecified: Secondary | ICD-10-CM

## 2013-10-28 LAB — CBC WITH DIFFERENTIAL/PLATELET
BASOS PCT: 1 % (ref 0–1)
Basophils Absolute: 0.1 10*3/uL (ref 0.0–0.1)
EOS ABS: 0.6 10*3/uL (ref 0.0–0.7)
Eosinophils Relative: 9 % — ABNORMAL HIGH (ref 0–5)
HCT: 42.5 % (ref 36.0–46.0)
HEMOGLOBIN: 15 g/dL (ref 12.0–15.0)
LYMPHS ABS: 1.7 10*3/uL (ref 0.7–4.0)
Lymphocytes Relative: 25 % (ref 12–46)
MCH: 31.3 pg (ref 26.0–34.0)
MCHC: 35.3 g/dL (ref 30.0–36.0)
MCV: 88.7 fL (ref 78.0–100.0)
Monocytes Absolute: 0.5 10*3/uL (ref 0.1–1.0)
Monocytes Relative: 7 % (ref 3–12)
NEUTROS PCT: 58 % (ref 43–77)
Neutro Abs: 3.9 10*3/uL (ref 1.7–7.7)
PLATELETS: 234 10*3/uL (ref 150–400)
RBC: 4.79 MIL/uL (ref 3.87–5.11)
RDW: 13.6 % (ref 11.5–15.5)
WBC: 6.7 10*3/uL (ref 4.0–10.5)

## 2013-10-28 LAB — COMPREHENSIVE METABOLIC PANEL
ALBUMIN: 4.5 g/dL (ref 3.5–5.2)
ALT: 17 U/L (ref 0–35)
AST: 17 U/L (ref 0–37)
Alkaline Phosphatase: 54 U/L (ref 39–117)
BILIRUBIN TOTAL: 0.6 mg/dL (ref 0.2–1.2)
BUN: 11 mg/dL (ref 6–23)
CO2: 29 mEq/L (ref 19–32)
Calcium: 10.1 mg/dL (ref 8.4–10.5)
Chloride: 103 mEq/L (ref 96–112)
Creat: 0.69 mg/dL (ref 0.50–1.10)
Glucose, Bld: 84 mg/dL (ref 70–99)
POTASSIUM: 4.6 meq/L (ref 3.5–5.3)
Sodium: 140 mEq/L (ref 135–145)
TOTAL PROTEIN: 6.6 g/dL (ref 6.0–8.3)

## 2013-10-28 LAB — LIPID PANEL
Cholesterol: 184 mg/dL (ref 0–200)
HDL: 69 mg/dL (ref >39)
LDL Cholesterol: 98 mg/dL (ref 0–99)
Total CHOL/HDL Ratio: 2.7 Ratio
Triglycerides: 86 mg/dL (ref <150)
VLDL: 17 mg/dL (ref 0–40)

## 2013-10-28 LAB — TSH: TSH: 2.332 u[IU]/mL (ref 0.350–4.500)

## 2013-10-29 ENCOUNTER — Ambulatory Visit (INDEPENDENT_AMBULATORY_CARE_PROVIDER_SITE_OTHER): Payer: Medicare Other | Admitting: Internal Medicine

## 2013-10-29 ENCOUNTER — Encounter: Payer: Self-pay | Admitting: Internal Medicine

## 2013-10-29 VITALS — BP 122/62 | HR 98 | Ht 62.25 in | Wt 116.0 lb

## 2013-10-29 DIAGNOSIS — E039 Hypothyroidism, unspecified: Secondary | ICD-10-CM

## 2013-10-29 DIAGNOSIS — Z0000 Encounter for general adult medical examination without abnormal findings: Secondary | ICD-10-CM | POA: Diagnosis not present

## 2013-10-29 DIAGNOSIS — I10 Essential (primary) hypertension: Secondary | ICD-10-CM

## 2013-10-29 DIAGNOSIS — G609 Hereditary and idiopathic neuropathy, unspecified: Secondary | ICD-10-CM | POA: Diagnosis not present

## 2013-10-29 LAB — POCT URINALYSIS DIPSTICK
Bilirubin, UA: NEGATIVE
Glucose, UA: NEGATIVE
Ketones, UA: NEGATIVE
Leukocytes, UA: NEGATIVE
NITRITE UA: NEGATIVE
PROTEIN UA: NEGATIVE
Spec Grav, UA: 1.01
UROBILINOGEN UA: NEGATIVE
pH, UA: 6.5

## 2013-12-18 ENCOUNTER — Encounter: Payer: Self-pay | Admitting: Nurse Practitioner

## 2013-12-18 ENCOUNTER — Ambulatory Visit (INDEPENDENT_AMBULATORY_CARE_PROVIDER_SITE_OTHER): Payer: Medicare Other | Admitting: Nurse Practitioner

## 2013-12-18 VITALS — BP 131/66 | HR 65 | Wt 118.0 lb

## 2013-12-18 DIAGNOSIS — G609 Hereditary and idiopathic neuropathy, unspecified: Secondary | ICD-10-CM

## 2013-12-18 NOTE — Progress Notes (Signed)
PATIENT: Christine Frost DOB: Jul 02, 1930  REASON FOR VISIT: routine follow up for neuropathy HISTORY FROM: patient  HISTORY OF PRESENT ILLNESS: UPDATE 12/18/13 (LL): She returns for annual followup. She tried PT after last visit but did not find it helpful. She again thinks her neuropathy has slightly progressed, but still no pain. She does not use any assistive device and remains very careful with her walking.  UPDATE 12/19/12: Patient presents for followup. She's had neuropathy for past 2 years. She feels like it is progressing. She denies any pain. She just has lack of sensation and poor balance. Numbness involves her feet, toes, ankles, up to her mid shins.  UPDATE 10/17/11: Patient returns for followup. She feels her numb feet are about the same but she feels more off balance at times. She does not use an assistive device but does have a cane. She has not fallen; she claims none of her activities have been curtailed. She remains independent in all activities of daily living. She mows her yard and works in her flowers. She does not have pain. She had not had issues with driving. No problems with bowel or bladder function  UPDATE 10/19/10: She feels her feet remain   numb  but there is not a marked difference. She does not have numbness in her hands. Her bowel and bladder function are normal. She feels the  progression of this disorder is very slow.She has an elevator to her first  floor at [employer]. She is independent in activities of daily living. She enjoys being in the yard. She has no difficulty driving. She denies pain.  She does not use a walker or a cane.No falls, no problems climbing a ladder. No new neurologic complaints. See ROS  PRIOR HPI: 89 year old right-handed white widowed female with a 7 year history of numbness in her feet secondary to an  axonal peripheral neuropathy, causing  gait disorder. She developed pain in November 2009 in her left Achilles region that I  thought maybe a tendinitis.  Doppler study of the venous system 02/15/2008 was unremarkable for evidence of phlebitis. She was seen by Dr. Esperanza Frost and felt to have an Achilles tear and as treated with physical therapy. Her symptoms improved. She fell when her neighbors dog jumped on her 06/13/2008 fracturing her right ankle at the distal fibula. This was a clean break and she was placed in a cam boot. She had good recovery from her right ankle fracture. In the fall 2010 she noticed that her left knee was swollen and painful wthout injury. She had a lateral and medial meniscus tear and underwent surgery 01/06/2009. She notices symptoms when sitting and getting up. She did her rehabilitation at home. A walker was prescribed but she did not need one.   REVIEW OF SYSTEMS: Full 14 system review of systems performed and notable only for numbness.   ALLERGIES: Allergies  Allergen Reactions  . Penicillins   . Pred Forte [Prednisolone Acetate]   . Amoxicillin-Pot Clavulanate Rash    HOME MEDICATIONS: Outpatient Prescriptions Prior to Visit  Medication Sig Dispense Refill  . aspirin 81 MG tablet Take 81 mg by mouth daily.      . Cholecalciferol (VITAMIN D-3) 1000 UNITS CAPS Take 1 capsule by mouth daily.    . Cyanocobalamin 1000 MCG CAPS Take by mouth daily.    . Glucosamine HCl 1000 MG TABS Take 1 tablet by mouth daily.    Marland Kitchen losartan (COZAAR) 50 MG tablet  TAKE 1 TABLET BY MOUTH DAILY 30 tablet 11  . Multiple Vitamins-Minerals (CENTRUM SILVER PO) Take by mouth.      . Omega-3 Fatty Acids (FISH OIL) 1200 MG CAPS Take 1 capsule by mouth 2 (two) times daily.     Marland Kitchen SYNTHROID 50 MCG tablet TAKE 1 TABLET BY MOUTH EVERY DAY 90 tablet 3   No facility-administered medications prior to visit.    PHYSICAL EXAM Filed Vitals:   12/18/13 0949  BP: 131/66  Pulse: 65  Weight: 118 lb (53.524 kg)   Body mass index is 21.41 kg/(m^2).  Generalized: Well developed, in no acute distress  Head: normocephalic and  atraumatic. Oropharynx benign  Neck: Supple, no carotid bruits  Cardiac: Regular rate rhythm, no murmur  Musculoskeletal: No deformity  Skin: 4 mm x 3 mm oval purplish discoloration on plantar surface of right foot, under 3rd phalanges.   NEUROLOGIC: MENTAL STATUS: awake, alert, language fluent, comprehension intact, naming intact CRANIAL NERVE: pupils equal and reactive to light, visual fields full to confrontation, extraocular muscles intact, no nystagmus, facial sensation and strength symmetric, uvula midline, shoulder shrug symmetric, tongue midline. MOTOR: normal bulk and tone, full strength in the BUE; BLE (PROX 5, DF 3, PF 3). SENSORY: VIB 8 SEC AT TOES. DECR PP IN FEET / ANKLES. COORDINATION: finger-nose-finger, fine finger movements normal REFLEXES: BUE 1, KNEES TRACE, ANKLES 0 GAIT/STATION: WIDE BASED STEPPAGE GAIT WITH FOOT SLAPPING BILATERALLY. CANNOT STAND ON TOES OR HEELS. DIFF STANDING FEET TOGETHER EYES OPEN. EVENTUALLY ABLE TO STAND FEET TOGETHER, AND THEN EYES CLOSED.    ASSESSMENT: 80 y.o. female here with idiopathic neuropathy. Continues to have problems with balance and gait difficulty. No pain fortunately. No falls.  Dx: idiopathic neuropathy  PLAN: - advised to monitor spot on right foot, if not resolved in 1 week, urged to see Dermatologist. I recommended that she inspect the bottom of her feet weekly with a mirror, since she does not have normal sensory feeling in the feet. - discussed safety, advised assistive device if needed, - Follow up annually and as needed with Dr. Leta Frost.  Christine Frost Christine Prescher, MSN, FNP-BC, A/GNP-C 12/18/2013, 10:10 AM [facility] [address] [place], [place] [address] [phone]  Note: This document was prepared with digital dictation and possible smart phrase technology. Any transcriptional errors that result from this process are unintentional.

## 2013-12-18 NOTE — Patient Instructions (Signed)
Please continue to be very careful when walking, an assistive device such as a walker would help.  Follow up in 1 year or as needed with Dr. Leta Baptist.   It is important to avoid accidents which may result in broken bones.  Here are a few ideas on how to make your home safer so you will be less likely to trip or fall.  1. Use nonskid mats or non slip strips in your shower or tub, on your bathroom floor and around sinks.  If you know that you have spilled water, wipe it up! 2. In the bathroom, it is important to have properly installed grab bars on the walls or on the edge of the tub.  Towel racks are NOT strong enough for you to hold onto or to pull on for support. 3. Stairs and hallways should have enough light.  Add lamps or night lights if you need ore light. 4. It is good to have handrails on both sides of the stairs if possible.  Always fix broken handrails right away. 5. It is important to see the edges of steps.  Paint the edges of outdoor steps white so you can see them better.  Put colored tape on the edge of inside steps. 6. Throw-rugs are dangerous because they can slide.  Removing the rugs is the best idea, but if they must stay, add adhesive carpet tape to prevent slipping. 7. Do not keep things on stairs or in the halls.  Remove small furniture that blocks the halls as it may cause you to trip.  Keep telephone and electrical cords out of the way where you walk. 8. Always were sturdy, rubber-soled shoes for good support.  Never wear just socks, especially on the stairs.  Socks may cause you to slip or fall.  Do not wear full-length housecoats as you can easily trip on the bottom.  9. Place the things you use the most on the shelves that are the easiest to reach.  If you use a stepstool, make sure it is in good condition.  If you feel unsteady, DO NOT climb, ask for help. 10. If a health professional advises you to use a cane or walker, do not be ashamed.  These items can keep you from  falling and breaking your bones.

## 2014-01-03 NOTE — Patient Instructions (Signed)
Continue same medications and return in one year. 
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
## 2014-01-13 ENCOUNTER — Telehealth: Payer: Self-pay

## 2014-01-13 NOTE — Progress Notes (Signed)
I reviewed note and agree with plan.   Niyanna Asch R. Derric Dealmeida, MD  Certified in Neurology, Neurophysiology and Neuroimaging  [facility] [address], [address] [place], [place] [address] [phone]   

## 2014-01-13 NOTE — Telephone Encounter (Signed)
Left message for patient to call office to let us know if/when they received flu vaccine. 

## 2014-01-13 NOTE — Telephone Encounter (Signed)
Patient declines flu vaccine. 

## 2014-01-27 DIAGNOSIS — H40021 Open angle with borderline findings, high risk, right eye: Secondary | ICD-10-CM | POA: Diagnosis not present

## 2014-01-27 DIAGNOSIS — H4011X2 Primary open-angle glaucoma, moderate stage: Secondary | ICD-10-CM | POA: Diagnosis not present

## 2014-01-27 DIAGNOSIS — H01001 Unspecified blepharitis right upper eyelid: Secondary | ICD-10-CM | POA: Diagnosis not present

## 2014-01-27 DIAGNOSIS — H01004 Unspecified blepharitis left upper eyelid: Secondary | ICD-10-CM | POA: Diagnosis not present

## 2014-02-25 DIAGNOSIS — H4011X2 Primary open-angle glaucoma, moderate stage: Secondary | ICD-10-CM | POA: Diagnosis not present

## 2014-04-29 ENCOUNTER — Ambulatory Visit (INDEPENDENT_AMBULATORY_CARE_PROVIDER_SITE_OTHER): Payer: Medicare Other | Admitting: Internal Medicine

## 2014-04-29 ENCOUNTER — Encounter: Payer: Self-pay | Admitting: Internal Medicine

## 2014-04-29 VITALS — BP 118/64 | HR 73 | Temp 97.4°F | Wt 120.0 lb

## 2014-04-29 DIAGNOSIS — E039 Hypothyroidism, unspecified: Secondary | ICD-10-CM

## 2014-04-29 NOTE — Patient Instructions (Signed)
Continue same medications and return in 6 months for physical examination.

## 2014-04-29 NOTE — Progress Notes (Signed)
   Subjective:    Patient ID: Christine Frost, female    DOB: 07/16/1930, 81 y.o.   MRN: [id number]  HPI  81 year old female in today for six-month recheck on TSH and hypertension. Blood pressure is excellent on current regimen. She has no new problems or complaints. Feels well. Stays active. TSH drawn today.    Review of Systems     Objective:   Physical Exam  Neck is supple without JVD thyromegaly or carotid bruits. Chest clear. Cardiac exam regular rate and rhythm normal S1 and S2. Extremities without edema.      Assessment & Plan:  Essential hypertension  Hypothyroidism  History of idiopathic peripheral neuropathy  Plan: Continue same medications and return in 6 months. TSH is pending.

## 2014-04-30 ENCOUNTER — Telehealth: Payer: Self-pay | Admitting: *Deleted

## 2014-04-30 LAB — TSH: TSH: 3.499 u[IU]/mL (ref 0.350–4.500)

## 2014-04-30 NOTE — Telephone Encounter (Signed)
Reviewed labs with patient. Patient instructed to remain on current dose of synthroid

## 2014-05-21 DIAGNOSIS — S83241A Other tear of medial meniscus, current injury, right knee, initial encounter: Secondary | ICD-10-CM | POA: Diagnosis not present

## 2014-05-22 DIAGNOSIS — H35342 Macular cyst, hole, or pseudohole, left eye: Secondary | ICD-10-CM | POA: Diagnosis not present

## 2014-05-22 DIAGNOSIS — H40012 Open angle with borderline findings, low risk, left eye: Secondary | ICD-10-CM | POA: Diagnosis not present

## 2014-05-22 DIAGNOSIS — H3531 Nonexudative age-related macular degeneration: Secondary | ICD-10-CM | POA: Diagnosis not present

## 2014-06-18 DIAGNOSIS — M25561 Pain in right knee: Secondary | ICD-10-CM | POA: Diagnosis not present

## 2014-06-20 ENCOUNTER — Other Ambulatory Visit: Payer: Self-pay | Admitting: Internal Medicine

## 2014-06-20 DIAGNOSIS — Z1231 Encounter for screening mammogram for malignant neoplasm of breast: Secondary | ICD-10-CM

## 2014-06-24 ENCOUNTER — Other Ambulatory Visit: Payer: Self-pay | Admitting: Internal Medicine

## 2014-06-24 DIAGNOSIS — H4011X2 Primary open-angle glaucoma, moderate stage: Secondary | ICD-10-CM | POA: Diagnosis not present

## 2014-07-09 DIAGNOSIS — D0512 Intraductal carcinoma in situ of left breast: Secondary | ICD-10-CM

## 2014-07-09 HISTORY — DX: Intraductal carcinoma in situ of left breast: D05.12

## 2014-07-14 ENCOUNTER — Ambulatory Visit (HOSPITAL_COMMUNITY)
Admission: RE | Admit: 2014-07-14 | Discharge: 2014-07-14 | Disposition: A | Discharge status: Home / Self Care | Payer: Medicare Other | Source: Ambulatory Visit | Attending: Internal Medicine | Admitting: Internal Medicine

## 2014-07-14 DIAGNOSIS — Z1231 Encounter for screening mammogram for malignant neoplasm of breast: Secondary | ICD-10-CM | POA: Diagnosis not present

## 2014-07-16 ENCOUNTER — Other Ambulatory Visit: Payer: Self-pay | Admitting: Internal Medicine

## 2014-07-16 DIAGNOSIS — R928 Other abnormal and inconclusive findings on diagnostic imaging of breast: Secondary | ICD-10-CM

## 2014-07-18 ENCOUNTER — Ambulatory Visit
Admission: RE | Admit: 2014-07-18 | Discharge: 2014-07-18 | Disposition: A | Discharge status: Home / Self Care | Payer: PRIVATE HEALTH INSURANCE | Source: Ambulatory Visit | Attending: Internal Medicine | Admitting: Internal Medicine

## 2014-07-18 ENCOUNTER — Other Ambulatory Visit: Payer: Self-pay | Admitting: Internal Medicine

## 2014-07-18 ENCOUNTER — Ambulatory Visit
Admission: RE | Admit: 2014-07-18 | Discharge: 2014-07-18 | Disposition: A | Discharge status: Home / Self Care | Payer: Medicare Other | Source: Ambulatory Visit | Attending: Internal Medicine | Admitting: Internal Medicine

## 2014-07-18 DIAGNOSIS — R928 Other abnormal and inconclusive findings on diagnostic imaging of breast: Secondary | ICD-10-CM

## 2014-07-18 DIAGNOSIS — N6489 Other specified disorders of breast: Secondary | ICD-10-CM | POA: Diagnosis not present

## 2014-07-24 ENCOUNTER — Other Ambulatory Visit: Payer: Self-pay | Admitting: Internal Medicine

## 2014-07-24 DIAGNOSIS — R928 Other abnormal and inconclusive findings on diagnostic imaging of breast: Secondary | ICD-10-CM

## 2014-07-30 ENCOUNTER — Ambulatory Visit
Admission: RE | Admit: 2014-07-30 | Discharge: 2014-07-30 | Disposition: A | Discharge status: Home / Self Care | Payer: Medicare Other | Source: Ambulatory Visit | Attending: Internal Medicine | Admitting: Internal Medicine

## 2014-07-30 ENCOUNTER — Other Ambulatory Visit: Payer: Self-pay | Admitting: Internal Medicine

## 2014-07-30 DIAGNOSIS — N6489 Other specified disorders of breast: Secondary | ICD-10-CM | POA: Diagnosis not present

## 2014-07-30 DIAGNOSIS — R928 Other abnormal and inconclusive findings on diagnostic imaging of breast: Secondary | ICD-10-CM | POA: Diagnosis not present

## 2014-07-30 DIAGNOSIS — D0512 Intraductal carcinoma in situ of left breast: Secondary | ICD-10-CM | POA: Diagnosis not present

## 2014-08-05 DIAGNOSIS — H40021 Open angle with borderline findings, high risk, right eye: Secondary | ICD-10-CM | POA: Diagnosis not present

## 2014-08-05 DIAGNOSIS — H4011X2 Primary open-angle glaucoma, moderate stage: Secondary | ICD-10-CM | POA: Diagnosis not present

## 2014-08-07 ENCOUNTER — Other Ambulatory Visit: Payer: Self-pay | Admitting: *Deleted

## 2014-08-07 MED ORDER — SYNTHROID 50 MCG PO TABS: 50.0000 ug | ORAL_TABLET | Freq: Every day | ORAL | Status: DC

## 2014-08-07 NOTE — Telephone Encounter (Signed)
Refill on synthroid sent to pharmacy. 

## 2014-08-11 ENCOUNTER — Ambulatory Visit: Payer: Medicare Other | Admitting: Radiation Oncology

## 2014-08-11 DIAGNOSIS — D0512 Intraductal carcinoma in situ of left breast: Secondary | ICD-10-CM | POA: Insufficient documentation

## 2014-08-22 ENCOUNTER — Other Ambulatory Visit: Payer: Self-pay | Admitting: General Surgery

## 2014-08-22 DIAGNOSIS — D0512 Intraductal carcinoma in situ of left breast: Secondary | ICD-10-CM | POA: Diagnosis not present

## 2014-08-25 ENCOUNTER — Encounter: Payer: Self-pay | Admitting: Internal Medicine

## 2014-08-25 ENCOUNTER — Ambulatory Visit (HOSPITAL_COMMUNITY)
Admission: RE | Admit: 2014-08-25 | Discharge: 2014-08-25 | Disposition: A | Discharge status: Home / Self Care | Payer: Medicare Other | Source: Ambulatory Visit | Attending: Internal Medicine | Admitting: Internal Medicine

## 2014-08-25 ENCOUNTER — Ambulatory Visit (INDEPENDENT_AMBULATORY_CARE_PROVIDER_SITE_OTHER): Payer: Medicare Other | Admitting: Internal Medicine

## 2014-08-25 ENCOUNTER — Telehealth: Payer: Self-pay | Admitting: *Deleted

## 2014-08-25 VITALS — BP 136/68 | HR 72 | Temp 98.1°F | Ht 62.0 in | Wt 113.0 lb

## 2014-08-25 DIAGNOSIS — M7989 Other specified soft tissue disorders: Secondary | ICD-10-CM

## 2014-08-25 DIAGNOSIS — Z23 Encounter for immunization: Secondary | ICD-10-CM

## 2014-08-25 DIAGNOSIS — L03115 Cellulitis of right lower limb: Secondary | ICD-10-CM

## 2014-08-25 DIAGNOSIS — M79606 Pain in leg, unspecified: Secondary | ICD-10-CM | POA: Diagnosis not present

## 2014-08-25 MED ORDER — CEPHALEXIN 500 MG PO CAPS: 500.0000 mg | ORAL_CAPSULE | Freq: Four times a day (QID) | ORAL | Status: DC

## 2014-08-25 MED ORDER — MUPIROCIN 2 % EX OINT: TOPICAL_OINTMENT | CUTANEOUS | Status: DC

## 2014-08-25 NOTE — Telephone Encounter (Signed)
Left message on answering machine to call back. Advise patient test was negative per Dr. Renold Genta.

## 2014-08-25 NOTE — Progress Notes (Signed)
   Subjective:    Patient ID: Christine Frost, female    DOB: Jun 08, 1930, 81 y.o.   MRN: [id number]  HPI In late June of piece of wrought iron fell on her right lower extremity. She has 3 separate abraded areas on the anterior lower leg shin area. Leg is erythematous. 2 of the abrasions appear to be healing but one does not appear to be. She said there has been no drainage. However leg has begun to swell and is quite tight.  She is also concerned about toenail fungus.  Has been diagnosed with breast cancer and has seen Dr. Donne Hazel who has arranged for her to be followed by Dr. Jana Hakim and Dr. Valere Dross.  Review of Systems     Objective:   Physical Exam  Right lower extremity: Is erythematous and is quite tight in the calf. It is not tender in the calf. No increased warmth. 3 separate abrasions anterior right lower extremity with the most proximal one not healed.      Assessment & Plan:  Cellulitis right lower extremity secondary to abrasions and trauma  New onset diagnosis of breast cancer  Rule out DVT right lower extremity  Plan: To have Doppler study later today. Keflex 500 mg 4 times daily for 7 days. Clean abrasions with peroxide and apply Bactroban ointment twice daily.  Tetanus immunization update given today. It would be expiring in 2017. Return in one week.

## 2014-08-25 NOTE — Telephone Encounter (Signed)
Received referral from [facility] requesting pt to see Dr. Jana Hakim.  Called and left a message for the pt to return my call so I can schedule her.

## 2014-08-25 NOTE — Patient Instructions (Signed)
Clean leg with peroxide and apply Bactroban twice daily. Keep leg elevated. Have Doppler study. Take Keflex 500 mg 4 times a day for 7 days. Return next week.

## 2014-08-25 NOTE — Progress Notes (Signed)
VASCULAR LAB PRELIMINARY  PRELIMINARY  PRELIMINARY  PRELIMINARY  Right lower extremity venous Doppler completed.    Preliminary report:  There is no DVT or SVT noted in the right lower extremity.   Jere Vanburen, RVT 08/25/2014, 4:22 PM

## 2014-08-25 NOTE — Telephone Encounter (Signed)
Pt returned my call and I confirmed 09/02/14 appt w/ pt.  Mailed before appt letter, calendar, welcoming packet & intake form to pt.  Emailed Alisha at [facility] to make her aware.  Placed a copy of records in Dr. Virgie Dad box and took one to HIM to scan.

## 2014-08-25 NOTE — Telephone Encounter (Signed)
Candace with [facility] called to advised that the patient had a negative test results.

## 2014-08-28 ENCOUNTER — Encounter: Payer: Self-pay | Admitting: Radiation Oncology

## 2014-08-28 NOTE — Progress Notes (Signed)
Location of Breast Cancer: Left Breast  Histology per Pathology Report:   07/30/14 Diagnosis Breast, left, needle core biopsy, central/medial - DUCTAL CARCINOMA IN SITU INVOLVING AN UNDERLYING COMPLEX SCLEROSING LESION.  Receptor Status: ER(100%), PR (95%), Her2-neu ( )  Ms. Christine Larsen. Frost had a rountine screening mammography which showed a possible distortion in the left breast. [place] - "demonstrating a vague irregular shadowing mass at 12 o'clock 1 cm from the nipple measuring 0.3 x 0.5 x 0.3 cm."  No lymphadenopathy seen in the left axilla.  Past/Anticipated interventions by surgeon, if any: Biopsy of left Breast Dr. Donne Frost  Past/Anticipated interventions by medical oncology, if any: Chemotherapy :appt with Dr. Jana Frost 7 /26/16 at 430pm  Lymphedema issues, if any: none  Pain issues, if any: none  SAFETY ISSUES:  Prior radiation? No  Pacemaker/ICD? No  Possible current pregnancy?No  Is the patient on methotrexate? No  Current Complaints / other details: right leg reddened,shiny,scabbed, on Keflex , iron rod fell on her leg cleaning out garage, U/s leg neg for clot goes back to Dr. Renold Frost 7/25/156  Menarche age 42, G72, P2, Menopause age  Hysterectomy age 14, still have both ovaries  BC= No, HRT No    Christine Frost, Christine Curb, RN 08/28/2014,9:23 AM

## 2014-08-29 ENCOUNTER — Ambulatory Visit
Admission: RE | Admit: 2014-08-29 | Discharge: 2014-08-29 | Disposition: A | Discharge status: Home / Self Care | Payer: Medicare Other | Source: Ambulatory Visit | Attending: Radiation Oncology | Admitting: Radiation Oncology

## 2014-08-29 ENCOUNTER — Encounter: Payer: Self-pay | Admitting: Radiation Oncology

## 2014-08-29 VITALS — BP 138/60 | HR 70 | Temp 97.8°F | Resp 20 | Ht 63.0 in | Wt 112.2 lb

## 2014-08-29 DIAGNOSIS — D0512 Intraductal carcinoma in situ of left breast: Secondary | ICD-10-CM

## 2014-08-29 HISTORY — DX: Malignant neoplasm of unspecified site of unspecified female breast: C50.919

## 2014-08-29 NOTE — Progress Notes (Signed)
Please see the Nurse Progress Note in the MD Initial Consult Encounter for this patient. 

## 2014-08-29 NOTE — Progress Notes (Addendum)
[facility] NEW PATIENT EVALUATION  Name: Christine Frost MRN: [id number]  Date:   08/29/2014           DOB: 1930-08-23  Status: outpatient   CC: Elby Showers, MD   Dr. Rolm Bookbinder   REFERRING PHYSICIAN: Dr. Rolm Bookbinder  DIAGNOSIS: DCIS of the left breast   HISTORY OF PRESENT ILLNESS:  Christine Frost is a 81 y.o. female who is seen today through the courtesy of Dr. Donne Hazel and Dr. Renold Genta for evaluation of her biopsy proven DCIS of the left breast.  At the time of a screening mammogram on 07/14/2014 she was felt to have possible distortion within the left breast.  Additional views on 07/18/2014 showed a persistent area of distortion in the central to slightly inner left breast.  Targeted ultrasound showed an irregular shadowing at 12:00, 1 cm from the nipple measuring 0.3 x 0.5 x 0.3 cm.  It was uncertain as to whether not this represented the area of distortion seen on mammography.  There was no lymphadenopathy seen within the left axilla.  A needle core biopsy on 07/30/2014 was diagnostic for ductal carcinoma involving an underlying complex sclerosing lesion.  The DCIS was felt to be intermediate grade and was ER positive at 100% and PR positive at 95%.  She is seeing Dr. Jana Hakim early next week for discussion of antiestrogen therapy.  Her surgery is tentatively scheduled for early August.  PREVIOUS RADIATION THERAPY: No   PAST MEDICAL HISTORY:  has a past medical history of Thyroid disease; Hyperlipidemia; Hypertension; Peripheral neuropathy; Right fibular fracture; Macular degeneration; Peripheral axonal neuropathy; Tear of tendon of left ankle; and Breast cancer (07/30/14).     PAST SURGICAL HISTORY:  Past Surgical History  Procedure Laterality Date  . Abdominal hysterectomy    . Eye surgery      Righ cataract  . Pars plana vitrectomy w/ repair of macular hole  7/98 & 11/98    Left  . Excision morton's neuroma  1974    bilateral   . Cholecystectomy    . Pilonidal cystectomy  1953  . Breast biopsy Left 07/30/14     FAMILY HISTORY: family history includes Alcohol abuse in her father; Heart disease in her brother and mother; Hypertension in her mother; Melanoma in her daughter; Stroke in her mother.  Her father died from complications of alcoholism with esophageal varices his 61s.  Her mother died following a stroke at 50.  No family history of breast cancer.   SOCIAL HISTORY:  reports that she has never smoked. She has never used smokeless tobacco. She reports that she drinks alcohol. She reports that she does not use illicit drugs.  Widowed for the past 18 years, 2 children.  A daughter works as a Marine scientist at the surgical center.  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   ALLERGIES: Penicillins; Pred forte; and Amoxicillin-pot clavulanate   MEDICATIONS:  Current Outpatient Prescriptions  Medication Sig Dispense Refill  . aspirin 81 MG tablet Take 81 mg by mouth daily.      . cephALEXin (KEFLEX) 500 MG capsule Take 1 capsule (500 mg total) by mouth 4 (four) times daily. 28 capsule 0  . Cholecalciferol (VITAMIN D-3) 1000 UNITS CAPS Take 1 capsule by mouth daily.    . Cyanocobalamin 1000 MCG CAPS Take by mouth daily.    . Glucosamine HCl 1000 MG TABS Take 1 tablet by mouth daily.    Marland Kitchen latanoprost (XALATAN)  0.005 % ophthalmic solution Place 1 drop into both eyes at bedtime.   0  . losartan (COZAAR) 50 MG tablet TAKE 1 TABLET BY MOUTH DAILY 90 tablet 1  . Multiple Vitamins-Minerals (CENTRUM SILVER PO) Take by mouth.      . mupirocin ointment (BACTROBAN) 2 % Use on leg twice a day 22 g 1  . Omega-3 Fatty Acids (FISH OIL) 1200 MG CAPS Take 1 capsule by mouth 2 (two) times daily.     Marland Kitchen SYNTHROID 50 MCG tablet Take 1 tablet (50 mcg total) by mouth daily. 90 tablet 0   No current facility-administered medications for this encounter.     REVIEW OF SYSTEMS:  Pertinent items are noted in HPI.    PHYSICAL EXAM:  height  is 5\' 3"  (1.6 m) and weight is 112 lb 3.2 oz (50.894 kg). Her oral temperature is 97.8 F (36.6 C). Her blood pressure is 138/60 and her pulse is 70. Her respiration is 20 and oxygen saturation is 100%.   Alert and oriented 81 year old white female appearing younger than her stated age.  Head and neck examination: There are scars along the forehead/face.  Nodes: There is no palpable cervical, supraclavicular, or axillary lymphadenopathy.  Breasts: There are a few areas of ecchymosis along the nipple areolar region of the left breast.  No masses are appreciated.  Right breast without masses or lesions.  Extremities: Without edema.   LABORATORY DATA:  Lab Results  Component Value Date   WBC 6.7 10/28/2013   HGB 15.0 10/28/2013   HCT 42.5 10/28/2013   MCV 88.7 10/28/2013   PLT 234 10/28/2013   Lab Results  Component Value Date   NA 140 10/28/2013   K 4.6 10/28/2013   CL 103 10/28/2013   CO2 29 10/28/2013   Lab Results  Component Value Date   ALT 17 10/28/2013   AST 17 10/28/2013   ALKPHOS 54 10/28/2013   BILITOT 0.6 10/28/2013      IMPRESSION: DCIS of the left breast.  I explained to Christine Frost the biological behavior of DCIS and choice of local therapies including mastectomy versus partial mastectomy.  The risk for local recurrence following conservative surgery is related to margins, size, and nuclear grade of DCIS..  I would predict that her risk for local failure following a partial mastectomy would be low in her case, and the benefit of radiation therapy may be only marginal.  Radiation therapy and antiestrogen therapy would only decrease the risk for local failure and would have no benefit on overall survival.  We can make a better determination after she has had definitive surgery.  Following her surgery, she will be presented at the Wednesday morning multidisciplinary conference, and I can see her for a follow-up visit postoperatively.  We briefly discussed the potential acute and late  toxicities of radiation therapy.     PLAN: As discussed above.  I spent 45 minutes face to face with the patient and more than 50% of that time was spent in counseling and/or coordination of care.

## 2014-08-30 NOTE — Addendum Note (Signed)
Encounter addended by: Arloa Koh, MD on: 08/30/2014  9:58 AM<BR>     Documentation filed: Notes Section

## 2014-09-01 ENCOUNTER — Encounter: Payer: Self-pay | Admitting: Internal Medicine

## 2014-09-01 ENCOUNTER — Ambulatory Visit (INDEPENDENT_AMBULATORY_CARE_PROVIDER_SITE_OTHER): Payer: Medicare Other | Admitting: Internal Medicine

## 2014-09-01 ENCOUNTER — Other Ambulatory Visit: Payer: Self-pay | Admitting: *Deleted

## 2014-09-01 VITALS — BP 112/58 | HR 75 | Temp 98.2°F | Wt 113.0 lb

## 2014-09-01 DIAGNOSIS — L03115 Cellulitis of right lower limb: Secondary | ICD-10-CM

## 2014-09-01 DIAGNOSIS — D0510 Intraductal carcinoma in situ of unspecified breast: Secondary | ICD-10-CM

## 2014-09-01 MED ORDER — CEFTRIAXONE SODIUM 1 G IJ SOLR
1.0000 g | Freq: Once | INTRAMUSCULAR | Status: AC
  Administered 2014-09-01: 1 g via INTRAMUSCULAR

## 2014-09-01 MED ORDER — CEFTRIAXONE SODIUM 1 G IJ SOLR: 1.0000 g | Freq: Once | INTRAMUSCULAR | Status: DC

## 2014-09-01 MED ORDER — CEPHALEXIN 500 MG PO CAPS: 500.0000 mg | ORAL_CAPSULE | Freq: Four times a day (QID) | ORAL | Status: DC

## 2014-09-01 NOTE — Patient Instructions (Signed)
Keep off leg as much possible. Refill Keflex x 10 days. RTC x 10 days. Rocephin one gram IM given.

## 2014-09-01 NOTE — Progress Notes (Signed)
   Subjective:    Patient ID: Christine Frost, female    DOB: 1930-03-22, 81 y.o.   MRN: [id number]  HPI To follow up right lower extremity cellulitis. Healing slowly. About to finish 7 days of Keflex. Has appt to see Dr. Jana Hakim tomorrow. Has seen Dr Valere Dross. Surgery has yet to be scheduled for breast cancer and probably should be deferred until cellulitis clears up. She has 3 lesions right anterior lower extremity. The lower lesion is weeping serous fluid today. She's been applying Bactroban ointment. Swelling has improved. Vascular ultrasound was negative for DVT.    Review of Systems     Objective:   Physical Exam  3 distinct lesions with surrounding erythema right lower extremity. These lesions seem to be slowly healing. The most proximal lesion is looking much improved and beginning to fill and. The most distal lesion is weeping serous fluid. Less swelling than initial visit.      Assessment & Plan:  Cellulitis right lower extremity-slowly resolving  Breast cancer  Plan: Continue Keflex for an additional 10 days. Follow-up in 10 days. Rocephin 1 g IM given today. Patient says that she's really not been staying off of the right lower extremity. Likes to stay busy. Encouraged her to rest and keep leg elevated.

## 2014-09-02 ENCOUNTER — Ambulatory Visit: Payer: Medicare Other

## 2014-09-02 ENCOUNTER — Encounter: Payer: Self-pay | Admitting: Oncology

## 2014-09-02 ENCOUNTER — Other Ambulatory Visit (HOSPITAL_BASED_OUTPATIENT_CLINIC_OR_DEPARTMENT_OTHER): Payer: Medicare Other

## 2014-09-02 ENCOUNTER — Ambulatory Visit (HOSPITAL_BASED_OUTPATIENT_CLINIC_OR_DEPARTMENT_OTHER): Payer: Medicare Other | Admitting: Oncology

## 2014-09-02 VITALS — BP 144/57 | HR 72 | Temp 98.5°F | Resp 18 | Ht 63.0 in | Wt 113.7 lb

## 2014-09-02 DIAGNOSIS — Z808 Family history of malignant neoplasm of other organs or systems: Secondary | ICD-10-CM

## 2014-09-02 DIAGNOSIS — D0512 Intraductal carcinoma in situ of left breast: Secondary | ICD-10-CM

## 2014-09-02 DIAGNOSIS — D0510 Intraductal carcinoma in situ of unspecified breast: Secondary | ICD-10-CM

## 2014-09-02 LAB — CBC WITH DIFFERENTIAL/PLATELET
BASO%: 1.2 % (ref 0.0–2.0)
BASOS ABS: 0.1 10*3/uL (ref 0.0–0.1)
EOS ABS: 0.3 10*3/uL (ref 0.0–0.5)
EOS%: 4.7 % (ref 0.0–7.0)
HCT: 40.5 % (ref 34.8–46.6)
HGB: 13.9 g/dL (ref 11.6–15.9)
LYMPH%: 24.6 % (ref 14.0–49.7)
MCH: 31.7 pg (ref 25.1–34.0)
MCHC: 34.3 g/dL (ref 31.5–36.0)
MCV: 92.3 fL (ref 79.5–101.0)
MONO#: 0.5 10*3/uL (ref 0.1–0.9)
MONO%: 7.5 % (ref 0.0–14.0)
NEUT#: 4.2 10*3/uL (ref 1.5–6.5)
NEUT%: 62 % (ref 38.4–76.8)
Platelets: 208 10*3/uL (ref 145–400)
RBC: 4.39 10*6/uL (ref 3.70–5.45)
RDW: 13.2 % (ref 11.2–14.5)
WBC: 6.8 10*3/uL (ref 3.9–10.3)
lymph#: 1.7 10*3/uL (ref 0.9–3.3)

## 2014-09-02 LAB — COMPREHENSIVE METABOLIC PANEL (CC13)
ALT: 23 U/L (ref 0–55)
AST: 22 U/L (ref 5–34)
Albumin: 4 g/dL (ref 3.5–5.0)
Alkaline Phosphatase: 60 U/L (ref 40–150)
Anion Gap: 8 mEq/L (ref 3–11)
BILIRUBIN TOTAL: 0.3 mg/dL (ref 0.20–1.20)
BUN: 13.1 mg/dL (ref 7.0–26.0)
CALCIUM: 9.6 mg/dL (ref 8.4–10.4)
CO2: 29 mEq/L (ref 22–29)
CREATININE: 0.7 mg/dL (ref 0.6–1.1)
Chloride: 106 mEq/L (ref 98–109)
EGFR: 81 mL/min/{1.73_m2} — ABNORMAL LOW (ref >90)
GLUCOSE: 86 mg/dL (ref 70–140)
POTASSIUM: 4.1 meq/L (ref 3.5–5.1)
Sodium: 143 mEq/L (ref 136–145)
Total Protein: 6.8 g/dL (ref 6.4–8.3)
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
## 2014-09-02 NOTE — Progress Notes (Signed)
Checked in new pt with no financial concerns. °

## 2014-09-03 ENCOUNTER — Other Ambulatory Visit: Payer: Medicare Other

## 2014-09-03 ENCOUNTER — Encounter: Payer: Self-pay | Admitting: *Deleted

## 2014-09-03 ENCOUNTER — Telehealth: Payer: Self-pay | Admitting: Oncology

## 2014-09-03 NOTE — Telephone Encounter (Signed)
Unable to reach. Mailed calendar to confirm appointment for September.

## 2014-09-05 ENCOUNTER — Other Ambulatory Visit: Payer: Self-pay | Admitting: General Surgery

## 2014-09-05 DIAGNOSIS — D0512 Intraductal carcinoma in situ of left breast: Secondary | ICD-10-CM

## 2014-09-11 ENCOUNTER — Encounter: Payer: Self-pay | Admitting: Internal Medicine

## 2014-09-11 ENCOUNTER — Ambulatory Visit: Payer: Medicare Other | Admitting: Radiation Oncology

## 2014-09-11 ENCOUNTER — Ambulatory Visit: Payer: Medicare Other

## 2014-09-11 ENCOUNTER — Ambulatory Visit (INDEPENDENT_AMBULATORY_CARE_PROVIDER_SITE_OTHER): Payer: Medicare Other | Admitting: Internal Medicine

## 2014-09-11 VITALS — BP 122/72 | HR 83 | Temp 98.2°F | Wt 114.0 lb

## 2014-09-11 DIAGNOSIS — L03115 Cellulitis of right lower limb: Secondary | ICD-10-CM | POA: Diagnosis not present

## 2014-09-11 NOTE — Patient Instructions (Signed)
Stop Keflex. Continue warm hot compresses to right calf twice daily for another few days. Call if symptoms do not improve.

## 2014-09-11 NOTE — Progress Notes (Signed)
   Subjective:    Patient ID: Christine Frost, female    DOB: 12-09-1930, 81 y.o.   MRN: [id number]  HPI In today to follow-up on right leg cellulitis. It is feeling much better. Thought she was having side effects with Keflex as she had diarrhea for about a day or so. She took Pepto-Bismol and diarrhea resolved . Has cut Flex back to 3 times daily. I think she can stop it at this point. She is to have surgery regarding breast cancer mid-August.    Review of Systems     Objective:   Physical Exam  Erythema right anterior lower leg has resolved. 3 separate lesions are healed. No exudate present. There is still trace edema in the right lower extremity with some firmness about her calf. We did check for DVT previously and it was negative.      Assessment & Plan:  Cellulitis right lower extremity-he'll  Plan: Discontinue Keflex. Continue warm Compresses to firm area right calf for a few more days. Increase activity. Call if symptoms do not improve.

## 2014-09-19 ENCOUNTER — Ambulatory Visit
Admission: RE | Admit: 2014-09-19 | Discharge: 2014-09-19 | Disposition: A | Discharge status: Home / Self Care | Payer: Medicare Other | Source: Ambulatory Visit | Attending: General Surgery | Admitting: General Surgery

## 2014-09-19 ENCOUNTER — Encounter (HOSPITAL_BASED_OUTPATIENT_CLINIC_OR_DEPARTMENT_OTHER): Payer: Self-pay | Admitting: *Deleted

## 2014-09-19 DIAGNOSIS — D0512 Intraductal carcinoma in situ of left breast: Secondary | ICD-10-CM | POA: Diagnosis not present

## 2014-09-19 NOTE — Pre-Procedure Instructions (Signed)
History of difficult intubation discussed with Dr. Linna Caprice; pt. OK to come for surgery. To come for EKG.

## 2014-09-22 ENCOUNTER — Encounter (HOSPITAL_BASED_OUTPATIENT_CLINIC_OR_DEPARTMENT_OTHER)
Admission: RE | Admit: 2014-09-22 | Discharge: 2014-09-22 | Disposition: A | Discharge status: Home / Self Care | Payer: Medicare Other | Source: Ambulatory Visit | Attending: General Surgery | Admitting: General Surgery

## 2014-09-22 ENCOUNTER — Other Ambulatory Visit: Payer: Self-pay

## 2014-09-22 DIAGNOSIS — D0592 Unspecified type of carcinoma in situ of left breast: Secondary | ICD-10-CM | POA: Diagnosis not present

## 2014-09-22 DIAGNOSIS — E039 Hypothyroidism, unspecified: Secondary | ICD-10-CM | POA: Diagnosis not present

## 2014-09-22 DIAGNOSIS — N6092 Unspecified benign mammary dysplasia of left breast: Secondary | ICD-10-CM | POA: Diagnosis not present

## 2014-09-22 DIAGNOSIS — I10 Essential (primary) hypertension: Secondary | ICD-10-CM | POA: Diagnosis not present

## 2014-09-22 DIAGNOSIS — G629 Polyneuropathy, unspecified: Secondary | ICD-10-CM | POA: Diagnosis not present

## 2014-09-22 NOTE — Telephone Encounter (Signed)
Please close encounter if patient was notified.

## 2014-09-24 ENCOUNTER — Ambulatory Visit (HOSPITAL_BASED_OUTPATIENT_CLINIC_OR_DEPARTMENT_OTHER)
Admission: RE | Admit: 2014-09-24 | Discharge: 2014-09-24 | Disposition: A | Discharge status: Home / Self Care | Payer: Medicare Other | Source: Ambulatory Visit | Attending: General Surgery | Admitting: General Surgery

## 2014-09-24 ENCOUNTER — Ambulatory Visit (HOSPITAL_BASED_OUTPATIENT_CLINIC_OR_DEPARTMENT_OTHER): Payer: Medicare Other | Admitting: Anesthesiology

## 2014-09-24 ENCOUNTER — Ambulatory Visit
Admission: RE | Admit: 2014-09-24 | Discharge: 2014-09-24 | Disposition: A | Discharge status: Home / Self Care | Payer: Medicare Other | Source: Ambulatory Visit | Attending: General Surgery | Admitting: General Surgery

## 2014-09-24 ENCOUNTER — Encounter (HOSPITAL_BASED_OUTPATIENT_CLINIC_OR_DEPARTMENT_OTHER)
Admission: RE | Disposition: A | Discharge status: Home / Self Care | Payer: Self-pay | Source: Ambulatory Visit | Attending: General Surgery

## 2014-09-24 ENCOUNTER — Encounter (HOSPITAL_BASED_OUTPATIENT_CLINIC_OR_DEPARTMENT_OTHER): Payer: Self-pay | Admitting: Anesthesiology

## 2014-09-24 ENCOUNTER — Telehealth: Payer: Self-pay | Admitting: *Deleted

## 2014-09-24 DIAGNOSIS — E039 Hypothyroidism, unspecified: Secondary | ICD-10-CM | POA: Diagnosis not present

## 2014-09-24 DIAGNOSIS — D0512 Intraductal carcinoma in situ of left breast: Secondary | ICD-10-CM | POA: Diagnosis not present

## 2014-09-24 DIAGNOSIS — N6012 Diffuse cystic mastopathy of left breast: Secondary | ICD-10-CM | POA: Diagnosis not present

## 2014-09-24 DIAGNOSIS — G629 Polyneuropathy, unspecified: Secondary | ICD-10-CM | POA: Diagnosis not present

## 2014-09-24 DIAGNOSIS — D0592 Unspecified type of carcinoma in situ of left breast: Secondary | ICD-10-CM | POA: Insufficient documentation

## 2014-09-24 DIAGNOSIS — I10 Essential (primary) hypertension: Secondary | ICD-10-CM | POA: Insufficient documentation

## 2014-09-24 DIAGNOSIS — N6092 Unspecified benign mammary dysplasia of left breast: Secondary | ICD-10-CM | POA: Insufficient documentation

## 2014-09-24 DIAGNOSIS — N6082 Other benign mammary dysplasias of left breast: Secondary | ICD-10-CM | POA: Diagnosis not present

## 2014-09-24 HISTORY — DX: Failed or difficult intubation, initial encounter: T88.4XXA

## 2014-09-24 HISTORY — DX: Personal history of diseases of the skin and subcutaneous tissue: Z87.2

## 2014-09-24 HISTORY — PX: BREAST LUMPECTOMY WITH RADIOACTIVE SEED LOCALIZATION: SHX6424

## 2014-09-24 HISTORY — DX: Hypothyroidism, unspecified: E03.9

## 2014-09-24 HISTORY — DX: Intraductal carcinoma in situ of left breast: D05.12

## 2014-09-24 LAB — POCT HEMOGLOBIN-HEMACUE: HEMOGLOBIN: 14.6 g/dL (ref 12.0–15.0)

## 2014-09-24 SURGERY — BREAST LUMPECTOMY WITH RADIOACTIVE SEED LOCALIZATION
Anesthesia: General | Site: Breast | Laterality: Left

## 2014-09-24 MED ORDER — EPHEDRINE SULFATE 50 MG/ML IJ SOLN
INTRAMUSCULAR | Status: DC | PRN
  Administered 2014-09-24: 5 mg via INTRAVENOUS
  Administered 2014-09-24: 10 mg via INTRAVENOUS

## 2014-09-24 MED ORDER — LACTATED RINGERS IV SOLN
INTRAVENOUS | Status: DC
  Administered 2014-09-24 (×2): via INTRAVENOUS

## 2014-09-24 MED ORDER — SCOPOLAMINE 1 MG/3DAYS TD PT72: 1.0000 | MEDICATED_PATCH | Freq: Once | TRANSDERMAL | Status: DC | PRN

## 2014-09-24 MED ORDER — HYDROCODONE-ACETAMINOPHEN 5-325 MG PO TABS: 1.0000 | ORAL_TABLET | ORAL | Status: DC | PRN

## 2014-09-24 MED ORDER — FENTANYL CITRATE (PF) 100 MCG/2ML IJ SOLN
INTRAMUSCULAR | Status: AC
  Filled 2014-09-24: qty 4

## 2014-09-24 MED ORDER — ONDANSETRON HCL 4 MG/2ML IJ SOLN
INTRAMUSCULAR | Status: DC | PRN
  Administered 2014-09-24: 4 mg via INTRAVENOUS

## 2014-09-24 MED ORDER — GLYCOPYRROLATE 0.2 MG/ML IJ SOLN: 0.2000 mg | Freq: Once | INTRAMUSCULAR | Status: DC | PRN

## 2014-09-24 MED ORDER — DEXAMETHASONE SODIUM PHOSPHATE 4 MG/ML IJ SOLN
INTRAMUSCULAR | Status: DC | PRN
  Administered 2014-09-24: 5 mg via INTRAVENOUS

## 2014-09-24 MED ORDER — LIDOCAINE HCL (CARDIAC) 20 MG/ML IV SOLN
INTRAVENOUS | Status: DC | PRN
  Administered 2014-09-24: 50 mg via INTRAVENOUS

## 2014-09-24 MED ORDER — BUPIVACAINE HCL (PF) 0.25 % IJ SOLN
INTRAMUSCULAR | Status: DC | PRN
  Administered 2014-09-24: 10 mL

## 2014-09-24 MED ORDER — HYDROMORPHONE HCL 1 MG/ML IJ SOLN: 0.2500 mg | INTRAMUSCULAR | Status: DC | PRN

## 2014-09-24 MED ORDER — CIPROFLOXACIN IN D5W 400 MG/200ML IV SOLN
400.0000 mg | INTRAVENOUS | Status: AC
Start: 2014-09-25 — End: 2014-09-24
  Administered 2014-09-24: 400 mg via INTRAVENOUS

## 2014-09-24 MED ORDER — CIPROFLOXACIN IN D5W 400 MG/200ML IV SOLN
INTRAVENOUS | Status: AC
  Filled 2014-09-24: qty 200

## 2014-09-24 MED ORDER — FENTANYL CITRATE (PF) 100 MCG/2ML IJ SOLN
50.0000 ug | INTRAMUSCULAR | Status: DC | PRN
  Administered 2014-09-24 (×2): 50 ug via INTRAVENOUS

## 2014-09-24 MED ORDER — HYDROCODONE-ACETAMINOPHEN 7.5-325 MG PO TABS: 1.0000 | ORAL_TABLET | Freq: Once | ORAL | Status: DC | PRN

## 2014-09-24 MED ORDER — MIDAZOLAM HCL 2 MG/2ML IJ SOLN: 1.0000 mg | INTRAMUSCULAR | Status: DC | PRN

## 2014-09-24 MED ORDER — PROMETHAZINE HCL 25 MG/ML IJ SOLN: 6.2500 mg | INTRAMUSCULAR | Status: DC | PRN

## 2014-09-24 MED ORDER — PROPOFOL 10 MG/ML IV BOLUS
INTRAVENOUS | Status: DC | PRN
  Administered 2014-09-24: 200 mg via INTRAVENOUS

## 2014-09-24 SURGICAL SUPPLY — 58 items
APPLIER CLIP 9.375 MED OPEN (MISCELLANEOUS) ×3
BINDER BREAST LRG (GAUZE/BANDAGES/DRESSINGS) IMPLANT
BINDER BREAST MEDIUM (GAUZE/BANDAGES/DRESSINGS) ×3 IMPLANT
BINDER BREAST XLRG (GAUZE/BANDAGES/DRESSINGS) IMPLANT
BINDER BREAST XXLRG (GAUZE/BANDAGES/DRESSINGS) IMPLANT
BLADE SURG 15 STRL LF DISP TIS (BLADE) ×1 IMPLANT
BLADE SURG 15 STRL SS (BLADE) ×2
CANISTER SUC SOCK COL 7IN (MISCELLANEOUS) IMPLANT
CANISTER SUCT 1200ML W/VALVE (MISCELLANEOUS) IMPLANT
CHLORAPREP W/TINT 26ML (MISCELLANEOUS) ×3 IMPLANT
CLIP APPLIE 9.375 MED OPEN (MISCELLANEOUS) ×1 IMPLANT
CLOSURE WOUND 1/2 X4 (GAUZE/BANDAGES/DRESSINGS) ×1
COVER BACK TABLE 60X90IN (DRAPES) ×3 IMPLANT
COVER MAYO STAND STRL (DRAPES) ×3 IMPLANT
COVER PROBE W GEL 5X96 (DRAPES) ×3 IMPLANT
DECANTER SPIKE VIAL GLASS SM (MISCELLANEOUS) IMPLANT
DEVICE DUBIN W/COMP PLATE 8390 (MISCELLANEOUS) ×3 IMPLANT
DRAPE LAPAROSCOPIC ABDOMINAL (DRAPES) ×3 IMPLANT
DRSG TEGADERM 4X4.75 (GAUZE/BANDAGES/DRESSINGS) IMPLANT
ELECT COATED BLADE 2.86 ST (ELECTRODE) ×3 IMPLANT
ELECT REM PT RETURN 9FT ADLT (ELECTROSURGICAL) ×3
ELECTRODE REM PT RTRN 9FT ADLT (ELECTROSURGICAL) ×1 IMPLANT
GLOVE BIO SURGEON STRL SZ7 (GLOVE) ×6 IMPLANT
GLOVE BIOGEL PI IND STRL 7.0 (GLOVE) ×1 IMPLANT
GLOVE BIOGEL PI IND STRL 7.5 (GLOVE) ×1 IMPLANT
GLOVE BIOGEL PI INDICATOR 7.0 (GLOVE) ×2
GLOVE BIOGEL PI INDICATOR 7.5 (GLOVE) ×2
GLOVE ECLIPSE 6.5 STRL STRAW (GLOVE) ×3 IMPLANT
GOWN STRL REUS W/ TWL LRG LVL3 (GOWN DISPOSABLE) ×2 IMPLANT
GOWN STRL REUS W/TWL LRG LVL3 (GOWN DISPOSABLE) ×4
ILLUMINATOR WAVEGUIDE N/F (MISCELLANEOUS) ×3 IMPLANT
KIT MARKER MARGIN INK (KITS) ×3 IMPLANT
LIGHT WAVEGUIDE WIDE FLAT (MISCELLANEOUS) IMPLANT
LIQUID BAND (GAUZE/BANDAGES/DRESSINGS) ×3 IMPLANT
MARKER SKIN DUAL TIP RULER LAB (MISCELLANEOUS) ×3 IMPLANT
NEEDLE HYPO 25X1 1.5 SAFETY (NEEDLE) ×3 IMPLANT
NS IRRIG 1000ML POUR BTL (IV SOLUTION) IMPLANT
PACK BASIN DAY SURGERY FS (CUSTOM PROCEDURE TRAY) ×3 IMPLANT
PENCIL BUTTON HOLSTER BLD 10FT (ELECTRODE) ×3 IMPLANT
SLEEVE SCD COMPRESS KNEE MED (MISCELLANEOUS) ×3 IMPLANT
SPONGE GAUZE 4X4 12PLY STER LF (GAUZE/BANDAGES/DRESSINGS) IMPLANT
SPONGE LAP 4X18 X RAY DECT (DISPOSABLE) ×3 IMPLANT
STAPLER VISISTAT 35W (STAPLE) ×3 IMPLANT
STRIP CLOSURE SKIN 1/2X4 (GAUZE/BANDAGES/DRESSINGS) ×2 IMPLANT
SUT MNCRL AB 4-0 PS2 18 (SUTURE) ×3 IMPLANT
SUT MON AB 5-0 PS2 18 (SUTURE) ×3 IMPLANT
SUT SILK 2 0 SH (SUTURE) IMPLANT
SUT VIC AB 2-0 SH 27 (SUTURE) ×2
SUT VIC AB 2-0 SH 27XBRD (SUTURE) ×1 IMPLANT
SUT VIC AB 3-0 SH 27 (SUTURE) ×3
SUT VIC AB 3-0 SH 27X BRD (SUTURE) ×1 IMPLANT
SUT VIC AB 5-0 PS2 18 (SUTURE) IMPLANT
SYR CONTROL 10ML LL (SYRINGE) ×3 IMPLANT
TOWEL OR 17X24 6PK STRL BLUE (TOWEL DISPOSABLE) ×3 IMPLANT
TOWEL OR NON WOVEN STRL DISP B (DISPOSABLE) ×3 IMPLANT
TUBE CONNECTING 20'X1/4 (TUBING)
TUBE CONNECTING 20X1/4 (TUBING) IMPLANT
YANKAUER SUCT BULB TIP NO VENT (SUCTIONS) IMPLANT

## 2014-09-24 NOTE — Anesthesia Preprocedure Evaluation (Addendum)
Anesthesia Evaluation  Patient identified by MRN, date of birth, ID band Patient awake    Reviewed: Allergy & Precautions, NPO status , Patient's Chart, lab work & pertinent test results  History of Anesthesia Complications (+) DIFFICULT AIRWAY  Airway Mallampati: II  TM Distance: >3 FB Neck ROM: Full    Dental  (+) Dental Advisory Given   Pulmonary neg pulmonary ROS,  breath sounds clear to auscultation        Cardiovascular hypertension, Pt. on medications Rhythm:Regular Rate:Normal     Neuro/Psych  Neuromuscular disease (peripheral neruopathy)    GI/Hepatic negative GI ROS, Neg liver ROS,   Endo/Other  Hypothyroidism   Renal/GU negative Renal ROS     Musculoskeletal   Abdominal   Peds  Hematology negative hematology ROS (+)   Anesthesia Other Findings   Reproductive/Obstetrics                            Anesthesia Physical Anesthesia Plan  ASA: II  Anesthesia Plan: General   Post-op Pain Management:    Induction: Intravenous  Airway Management Planned: LMA  Additional Equipment:   Intra-op Plan:   Post-operative Plan:   Informed Consent: I have reviewed the patients History and Physical, chart, labs and discussed the procedure including the risks, benefits and alternatives for the proposed anesthesia with the patient or authorized representative who has indicated his/her understanding and acceptance.   Dental advisory given  Plan Discussed with: CRNA  Anesthesia Plan Comments:         Anesthesia Quick Evaluation

## 2014-09-24 NOTE — Anesthesia Postprocedure Evaluation (Signed)
  Anesthesia Post-op Note  Patient: Christine Frost  Procedure(s) Performed: Procedure(s): LEFT BREAST LUMPECTOMY WITH RADIOACTIVE SEED LOCALIZATION (Left)  Patient Location: PACU  Anesthesia Type:General  Level of Consciousness: awake, alert  and oriented  Airway and Oxygen Therapy: Patient Spontanous Breathing  Post-op Pain: none  Post-op Assessment: Post-op Vital signs reviewed              Post-op Vital Signs: Reviewed  Last Vitals:  Filed Vitals:   09/24/14 1515  BP: 142/70  Pulse: 84  Temp:   Resp: 21    Complications: No apparent anesthesia complications

## 2014-09-24 NOTE — Op Note (Signed)
Preoperative diagnosis: stage 0 left breast cancer Postoperative diagnosis: same as above Procedure:Left breast seed guided lumpectomy Surgeon Dr Serita Grammes Anes general with LMA EBL: minimal Comps none Specimen:  Left breast tissue marked with paint Sponge count correct at completion dispo to recovery stable  Indications: This is an 33 yof who presents with mm found left breast dcis.  We discussed options and elected to proceed with seed guided lumpectomy.  We decided to omit sentinel node biopsy.  She has been seen by Drs Jana Hakim and Valere Dross prior to beginning. . She had radioactive seed placed prior to beginning. I had these mm in the OR.   Procedure: After informed consent was obtained the patient was taken to the OR. Marland Kitchen She was given ciprofloxacin  SCDs were in place. She was prepped and draped in the standard sterile surgical fashion. A timeout was performed. I infiltrated marcaine in the skin.I made a periareolar incision and used the neoprobe to guide my way to the tumor. I used the lighted retractor system to do the operation. I then removed the seed and the surrounding tissue with an attempt to get a clear margin.  I did confirm removal of both the seed and the clip with mammography.This was confirmed by radiology. I then obtained hemostasis. Clips were placed in cavity. I then closed with 2-0 vicryl, 3-0 vicryl and 5-0 monocryl. Glue and steristrips were applied. A breast binder was placed. She was extubated and transferred to recovery stable

## 2014-09-24 NOTE — H&P (Signed)
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 She then underwent an [place] that showed at 12 oclock 1 cm from the nipple measuring 5x3x3 mm in size. No lad seen in left axilla. this underwent stereo biopsy with clip placement with pathology showing likely intermediate grade, dcis involved in underlying sclerosing lesion that is er pos at 100% and pr pos at 95%. She has no issues with either breast from discharge or mass. She has no prior history. 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 Hysterectomy (not due to cancer) - Partial Knee Surgery Left.  Diagnostic Studies History Christine Frost, CMA; 08/22/2014 10:26 AM) Colonoscopy within last year Mammogram within last year Pap Smear 1-5 years ago  Allergies Davy Pique Bynum, CMA; 08/22/2014 10:27 AM) Penicillamine *ASSORTED CLASSES* Pred Forte *OPHTHALMIC AGENTS* Amoxicillin *PENICILLINS*  Medication History ([name], CMA; 08/22/2014 10:28 AM) Latanoprost (0.005% Solution, Ophthalmic) Active. Losartan Potassium (50MG  Tablet, Oral) Active. Synthroid (50MCG Tablet, Oral) Active. Multivitamins (Oral) Active. 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 Caffeine use Carbonated beverages, Coffee, Tea. No drug use Tobacco use Never smoker.  Family History Christine Frost, [place]; 08/22/2014 10:26 AM) Alcohol Abuse Father. Cerebrovascular Accident Mother. Heart Disease Mother. Hypertension Mother. Melanoma Daughter.  Pregnancy / Birth History Christine Frost, [place]; 08/22/2014 10:26 AM) Age at menarche 89 years. Age of menopause 49-55 Grav[place]  2 Maternal age 79-30 Para 2  Review of Systems ([place]; 08/22/2014 10:26 AM) General Not Present- Appetite Loss, Chills, Fatigue, Fever, Night Sweats, Weight Gain and Weight Loss. Skin Not Present- Change in Wart/Mole, Dryness, Hives, Jaundice, New Lesions, Non-Healing Wounds, Rash and Ulcer. HEENT Not Present- Earache, Hearing Loss, Hoarseness, Nose Bleed, Oral Ulcers, Ringing in the Ears, Seasonal Allergies, Sinus Pain, Sore Throat, Visual Disturbances, Wears glasses/contact lenses and Yellow Eyes. Respiratory Not Present- Bloody sputum, Chronic Cough, Difficulty Breathing, Snoring and Wheezing. Breast Not Present- Breast Mass, Breast Pain, Nipple Discharge and Skin Changes. Cardiovascular Not Present- Chest Pain, Difficulty Breathing Lying Down, Leg Cramps, Palpitations, Rapid Heart Rate, Shortness of Breath and Swelling of Extremities. Gastrointestinal Not Present- Abdominal Pain, Bloating, Bloody Stool, Change in Bowel Habits, Chronic diarrhea, Constipation, Difficulty Swallowing, Excessive gas, Gets full quickly at meals, Hemorrhoids, Indigestion, Nausea, Rectal Pain and Vomiting. Female Genitourinary Not Present- Frequency, Nocturia, Painful Urination, Pelvic Pain and Urgency. Musculoskeletal Not Present- Back Pain, Joint Pain, Joint Stiffness, Muscle Pain, Muscle Weakness and Swelling of Extremities. Psychiatric Not Present- Anxiety, Bipolar, Change in Sleep Pattern, Depression, Fearful and Frequent crying. Endocrine Not Present- Cold Intolerance, Excessive Hunger, Hair Changes, Heat Intolerance, Hot flashes and New Diabetes. Hematology Not Present- Easy Bruising, Excessive bleeding, Gland problems, HIV and Persistent Infections.   Vitals ([name] CMA; 08/22/2014 10:27 AM) 08/22/2014 10:26 AM Weight: 112 lb Height: 62in Body Surface Area: 1.49 m Body Mass Index: 20.48 kg/m Temp.: 76F(Temporal)  Pulse: 79 (Regular)  BP: 132/80 (Sitting, Left Arm,  Standard)    Physical Exam Rolm Bookbinder MD; 08/22/2014 11:13 AM) General Mental Status-Alert. Orientation-Oriented X3.  Chest and Lung Exam Chest and lung exam reveals -on auscultation, normal breath sounds, no adventitious sounds and normal vocal resonance.  Breast Nipples-No Discharge. Breast Lump-No Palpable Breast  Mass.  Cardiovascular Cardiovascular examination reveals -normal heart sounds, regular rate and rhythm with no murmurs.  Lymphatic Head & Neck  General Head & Neck Lymphatics: Bilateral - Description - Normal. Axillary  General Axillary Region: Bilateral - Description - Normal. Note: no [place] adenopathy     Assessment & Plan Rolm Bookbinder MD; 08/22/2014 2:27 PM) DCIS (DUCTAL CARCINOMA IN SITU), LEFT (233.0  D05.12) Story: Left breast radioactive seed guided lumpectomy We discussed the staging and pathophysiology of breast cancer. We discussed all of the different options for treatment for breast cancer including surgery, chemotherapy, radiation therapy, Herceptin, and antiestrogen therapy. We discussed the options for treatment of the breast cancer which included lumpectomy versus a mastectomy. We discussed the performance of the lumpectomy with radioactive seed placement. We discussed a 5-10% chance of a positive margin requiring reexcision in the operating room. We also discussed that she will likely need radiation therapy (this is usually 5-7 weeks) and/or antiestrogen if she undergoes lumpectomy. The breast cannot undergo more radiation therapy in the same breast after lumpectomy in the future. We discussed the mastectomy (removal of whole breast) and the postoperative care for that as well. Mastectomy can be followed by reconstruction. This is a more extensive surgery and requires more recovery. We discussed that there is no difference in her survival whether she undergoes lumpectomy with radiation therapy or antiestrogen therapy versus a  mastectomy. There is also no real difference between her recurrence in the breast. I do not think she needs sentinel node biopsy given her dcis. She understands there is chance of invasive disease on final path also. will also refer her to see Dr Jana Hakim and Dr Valere Dross for discussion of adjuvant therapy. We discussed the risks of operation including bleeding, infection, possible reoperation. She understands her further therapy will be based on what her stages at the time of her operation.

## 2014-09-24 NOTE — Telephone Encounter (Signed)
Results of venous dopplar were reviewed with patient at Office visit 09/01/14 per Dr Renold Genta

## 2014-09-24 NOTE — Discharge Instructions (Signed)
[facility] Office Phone Number [phone]  B POST OP INSTRUCTIONS  Always review your discharge instruction sheet given to you by the facility where your surgery was performed.  IF YOU HAVE DISABILITY OR FAMILY LEAVE FORMS, YOU MUST BRING THEM TO THE OFFICE FOR PROCESSING.  DO NOT GIVE THEM TO YOUR DOCTOR.  1. A prescription for pain medication may be given to you upon discharge.  Take your pain medication as prescribed, if needed.  If narcotic pain medicine is not needed, then you may take acetaminophen (Tylenol), naprosyn (Alleve) or ibuprofen (Advil) as needed. 2. Take your usually prescribed medications unless otherwise directed 3. If you need a refill on your pain medication, please contact your pharmacy.  They will contact our office to request authorization.  Prescriptions will not be filled after 5pm or on week-ends. 4. You should eat very light the first 24 hours after surgery, such as soup, crackers, pudding, etc.  Resume your normal diet the day after surgery. 5. Most patients will experience some swelling and bruising in the breast.  Ice packs and a good support bra will help.  Wear the breast binder provided or a sports bra for 72 hours day and night.  After that wear a sports bra during the day until you return to the office. Swelling and bruising can take several days to resolve.  6. It is common to experience some constipation if taking pain medication after surgery.  Increasing fluid intake and taking a stool softener will usually help or prevent this problem from occurring.  A mild laxative (Milk of Magnesia or Miralax) should be taken according to package directions if there are no bowel movements after 48 hours. 7. Unless discharge instructions indicate otherwise, you may remove your bandages 48 hours after surgery and you may shower at that time.  You may have steri-strips (small skin tapes) in place directly over the incision.  These strips should be left on  the skin for 7-10 days and will come off on their own.  If your surgeon used skin glue on the incision, you may shower in 24 hours.  The glue will flake off over the next 2-3 weeks.  Any sutures or staples will be removed at the office during your follow-up visit. 8. ACTIVITIES:  You may resume regular daily activities (gradually increasing) beginning the next day.  Wearing a good support bra or sports bra minimizes pain and swelling.  You may have sexual intercourse when it is comfortable. a. You may drive when you no longer are taking prescription pain medication, you can comfortably wear a seatbelt, and you can safely maneuver your car and apply brakes. b. RETURN TO WORK:  ______________________________________________________________________________________ 9. You should see your doctor in the office for a follow-up appointment approximately two weeks after your surgery.  Your doctors nurse will typically make your follow-up appointment when she calls you with your pathology report.  Expect your pathology report 3-4 business days after your surgery.  You may call to check if you do not hear from [place] after three days. 10. OTHER INSTRUCTIONS: _______________________________________________________________________________________________ _____________________________________________________________________________________________________________________________________ _____________________________________________________________________________________________________________________________________ _____________________________________________________________________________________________________________________________________  WHEN TO CALL DR WAKEFIELD: 1. Fever over 101.0 2. Nausea and/or vomiting. 3. Extreme swelling or bruising. 4. Continued bleeding from incision. 5. Increased pain, redness, or drainage from the incision.  The clinic staff is available to answer your questions during  regular business hours.  Please dont hesitate to call and ask to speak to one of the nurses for clinical concerns.  If you have a medical emergency, go to the nearest emergency room or call 911.  A surgeon from [facility] is always on call at the hospital.  For further questions, please visit [email or web address]     Post Anesthesia Home Care Instructions  Activity: Get plenty of rest for the remainder of the day. A responsible adult should stay with you for 24 hours following the procedure.  For the next 24 hours, DO NOT: -Drive a car -Paediatric nurse -Drink alcoholic beverages -Take any medication unless instructed by your physician -Make any legal decisions or sign important papers.  Meals: Start with liquid foods such as gelatin or soup. Progress to regular foods as tolerated. Avoid greasy, spicy, heavy foods. If nausea and/or vomiting occur, drink only clear liquids until the nausea and/or vomiting subsides. Call your physician if vomiting continues.  Special Instructions/Symptoms: Your throat may feel dry or sore from the anesthesia or the breathing tube placed in your throat during surgery. If this causes discomfort, gargle with warm salt water. The discomfort should disappear within 24 hours.  If you had a scopolamine patch placed behind your ear for the management of post- operative nausea and/or vomiting:  1. The medication in the patch is effective for 72 hours, after which it should be removed.  Wrap patch in a tissue and discard in the trash. Wash hands thoroughly with soap and water. 2. You may remove the patch earlier than 72 hours if you experience unpleasant side effects which may include dry mouth, dizziness or visual disturbances. 3. Avoid touching the patch. Wash your hands with soap and water after contact with the patch.

## 2014-09-24 NOTE — Anesthesia Procedure Notes (Signed)
Procedure Name: LMA Insertion Date/Time: 09/24/2014 1:35 PM Performed by: Lieutenant Diego Pre-anesthesia Checklist: Patient identified, Emergency Drugs available, Suction available and Patient being monitored Patient Re-evaluated:Patient Re-evaluated prior to inductionOxygen Delivery Method: Circle System Utilized Preoxygenation: Pre-oxygenation with 100% oxygen Intubation Type: IV induction Ventilation: Mask ventilation without difficulty LMA: LMA inserted LMA Size: 3.0 Number of attempts: 1 Airway Equipment and Method: Bite block Placement Confirmation: positive ETCO2 and breath sounds checked- equal and bilateral Tube secured with: Tape Dental Injury: Teeth and Oropharynx as per pre-operative assessment

## 2014-09-24 NOTE — Transfer of Care (Signed)
Immediate Anesthesia Transfer of Care Note  Patient: Christine Frost  Procedure(s) Performed: Procedure(s): LEFT BREAST LUMPECTOMY WITH RADIOACTIVE SEED LOCALIZATION (Left)  Patient Location: PACU  Anesthesia Type:General  Level of Consciousness: awake and alert   Airway & Oxygen Therapy: Patient Spontanous Breathing and Patient connected to face mask oxygen  Post-op Assessment: Report given to RN and Post -op Vital signs reviewed and stable  Post vital signs: Reviewed and stable  Last Vitals:  Filed Vitals:   09/24/14 1237  BP: 143/73  Pulse: 76  Temp: 36.8 C  Resp: 20    Complications: No apparent anesthesia complications

## 2014-09-24 NOTE — Interval H&P Note (Signed)
History and Physical Interval Note:  09/24/2014 1:09 PM  Christine Frost  has presented today for surgery, with the diagnosis of LEFT BREAST DCIS  The various methods of treatment have been discussed with the patient and family. After consideration of risks, benefits and other options for treatment, the patient has consented to  Procedure(s): LEFT BREAST LUMPECTOMY WITH RADIOACTIVE SEED LOCALIZATION (Left) as a surgical intervention .  The patient's history has been reviewed, patient examined, no change in status, stable for surgery.  I have reviewed the patient's chart and labs.  Questions were answered to the patient's satisfaction.     [name]

## 2014-09-25 ENCOUNTER — Encounter (HOSPITAL_BASED_OUTPATIENT_CLINIC_OR_DEPARTMENT_OTHER): Payer: Self-pay | Admitting: General Surgery

## 2014-09-29 ENCOUNTER — Other Ambulatory Visit: Payer: Self-pay | Admitting: Oncology

## 2014-10-06 NOTE — Telephone Encounter (Signed)
Per Dr Renold Genta, patient was notified of results in an office visit.

## 2014-10-06 NOTE — Telephone Encounter (Signed)
Per our previous conversation please have this documented and closed.

## 2014-10-30 ENCOUNTER — Other Ambulatory Visit: Payer: PRIVATE HEALTH INSURANCE | Admitting: Internal Medicine

## 2014-10-31 ENCOUNTER — Encounter: Payer: PRIVATE HEALTH INSURANCE | Admitting: Internal Medicine

## 2014-11-03 ENCOUNTER — Ambulatory Visit (HOSPITAL_BASED_OUTPATIENT_CLINIC_OR_DEPARTMENT_OTHER): Payer: Medicare Other | Admitting: Oncology

## 2014-11-03 ENCOUNTER — Encounter: Payer: Self-pay | Admitting: *Deleted

## 2014-11-03 ENCOUNTER — Telehealth: Payer: Self-pay | Admitting: Oncology

## 2014-11-03 VITALS — BP 127/62 | HR 77 | Temp 98.5°F | Resp 18 | Ht 63.0 in | Wt 113.6 lb

## 2014-11-03 DIAGNOSIS — D0512 Intraductal carcinoma in situ of left breast: Secondary | ICD-10-CM
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
## 2014-11-03 NOTE — Telephone Encounter (Signed)
Gave avs & calendar for September 2017 °

## 2014-11-07 DIAGNOSIS — H4011X2 Primary open-angle glaucoma, moderate stage: Secondary | ICD-10-CM | POA: Diagnosis not present

## 2014-11-07 DIAGNOSIS — H40021 Open angle with borderline findings, high risk, right eye: Secondary | ICD-10-CM | POA: Diagnosis not present

## 2014-11-20 ENCOUNTER — Other Ambulatory Visit: Payer: Self-pay | Admitting: Internal Medicine

## 2014-12-08 ENCOUNTER — Encounter: Payer: Self-pay | Admitting: Adult Health

## 2014-12-08 NOTE — Progress Notes (Signed)
A birthday card was mailed to the patient today on behalf of the Survivorship Program at [facility].   Autry Droege, NP Survivorship Program [facility] [phone]  

## 2014-12-18 ENCOUNTER — Other Ambulatory Visit: Payer: Medicare Other | Admitting: Internal Medicine

## 2014-12-18 DIAGNOSIS — I10 Essential (primary) hypertension: Secondary | ICD-10-CM

## 2014-12-18 DIAGNOSIS — Z79899 Other long term (current) drug therapy: Secondary | ICD-10-CM

## 2014-12-18 DIAGNOSIS — E039 Hypothyroidism, unspecified: Secondary | ICD-10-CM

## 2014-12-18 DIAGNOSIS — Z0000 Encounter for general adult medical examination without abnormal findings: Secondary | ICD-10-CM | POA: Diagnosis not present

## 2014-12-18 LAB — COMPLETE METABOLIC PANEL WITH GFR
ALBUMIN: 4.3 g/dL (ref 3.6–5.1)
ALK PHOS: 55 U/L (ref 33–130)
ALT: 19 U/L (ref 6–29)
AST: 20 U/L (ref 10–35)
BUN: 13 mg/dL (ref 7–25)
CALCIUM: 9.7 mg/dL (ref 8.6–10.4)
CO2: 28 mmol/L (ref 20–31)
Chloride: 105 mmol/L (ref 98–110)
Creat: 0.62 mg/dL (ref 0.60–0.88)
GFR, EST NON AFRICAN AMERICAN: 85 mL/min (ref >=60)
GFR, Est African American: >89 mL/min (ref >=60)
Glucose, Bld: 83 mg/dL (ref 65–99)
POTASSIUM: 4.5 mmol/L (ref 3.5–5.3)
Sodium: 143 mmol/L (ref 135–146)
Total Bilirubin: 0.6 mg/dL (ref 0.2–1.2)
Total Protein: 6.6 g/dL (ref 6.1–8.1)

## 2014-12-18 LAB — CBC WITH DIFFERENTIAL/PLATELET
Basophils Absolute: 0.1 10*3/uL (ref 0.0–0.1)
Basophils Relative: 2 % — ABNORMAL HIGH (ref 0–1)
Eosinophils Absolute: 0.1 10*3/uL (ref 0.0–0.7)
Eosinophils Relative: 3 % (ref 0–5)
HEMATOCRIT: 43.8 % (ref 36.0–46.0)
HEMOGLOBIN: 14.9 g/dL (ref 12.0–15.0)
LYMPHS ABS: 1.7 10*3/uL (ref 0.7–4.0)
LYMPHS PCT: 35 % (ref 12–46)
MCH: 31 pg (ref 26.0–34.0)
MCHC: 34 g/dL (ref 30.0–36.0)
MCV: 91.3 fL (ref 78.0–100.0)
MONO ABS: 0.5 10*3/uL (ref 0.1–1.0)
MONOS PCT: 10 % (ref 3–12)
MPV: 8.8 fL (ref 8.6–12.4)
NEUTROS ABS: 2.4 10*3/uL (ref 1.7–7.7)
Neutrophils Relative %: 50 % (ref 43–77)
Platelets: 225 10*3/uL (ref 150–400)
RBC: 4.8 MIL/uL (ref 3.87–5.11)
RDW: 13.4 % (ref 11.5–15.5)
WBC: 4.8 10*3/uL (ref 4.0–10.5)

## 2014-12-18 LAB — LIPID PANEL
CHOL/HDL RATIO: 2.6 ratio (ref <=5.0)
CHOLESTEROL: 187 mg/dL (ref 125–200)
HDL: 72 mg/dL (ref >=46)
LDL Cholesterol: 99 mg/dL (ref <130)
Triglycerides: 79 mg/dL (ref <150)
VLDL: 16 mg/dL (ref <30)

## 2014-12-18 LAB — TSH: TSH: 1.544 u[IU]/mL (ref 0.350–4.500)

## 2014-12-19 ENCOUNTER — Ambulatory Visit (INDEPENDENT_AMBULATORY_CARE_PROVIDER_SITE_OTHER): Payer: Medicare Other | Admitting: Internal Medicine

## 2014-12-19 ENCOUNTER — Ambulatory Visit (INDEPENDENT_AMBULATORY_CARE_PROVIDER_SITE_OTHER): Payer: Medicare Other | Admitting: Diagnostic Neuroimaging

## 2014-12-19 ENCOUNTER — Encounter: Payer: Self-pay | Admitting: Diagnostic Neuroimaging

## 2014-12-19 ENCOUNTER — Encounter: Payer: Self-pay | Admitting: Internal Medicine

## 2014-12-19 VITALS — BP 118/68 | HR 74 | Temp 97.4°F | Resp 18 | Ht 63.0 in | Wt 114.0 lb

## 2014-12-19 VITALS — BP 131/65 | HR 73 | Ht 63.0 in | Wt 114.6 lb

## 2014-12-19 DIAGNOSIS — Z0000 Encounter for general adult medical examination without abnormal findings: Secondary | ICD-10-CM | POA: Diagnosis not present

## 2014-12-19 DIAGNOSIS — E039 Hypothyroidism, unspecified: Secondary | ICD-10-CM

## 2014-12-19 DIAGNOSIS — D0512 Intraductal carcinoma in situ of left breast: Secondary | ICD-10-CM

## 2014-12-19 DIAGNOSIS — G609 Hereditary and idiopathic neuropathy, unspecified: Secondary | ICD-10-CM

## 2014-12-19 DIAGNOSIS — I10 Essential (primary) hypertension: Secondary | ICD-10-CM | POA: Diagnosis not present

## 2014-12-19 NOTE — Patient Instructions (Signed)
Thank you for coming to see [place] at [facility]. I hope we have been able to provide you high quality care today.  You may receive a patient satisfaction survey over the next few weeks. We would appreciate your feedback and comments so that we may continue to improve ourselves and the health of our patients.  - caution with balance and walking   ~~~~~~~~~~~~~~~~~~~~~~~~~~~~~~~~~~~~~~~~~~~~~~~~~~~~~~~~~~~~~~~~~  DR. PENUMALLI'S GUIDE TO HAPPY AND HEALTHY LIVING These are some of my general health and wellness recommendations. Some of them may apply to you better than others. Please use common sense as you try these suggestions and feel free to ask me any questions.   ACTIVITY/FITNESS Mental, social, emotional and physical stimulation are very important for brain and body health. Try learning a new activity (arts, music, language, sports, games).  Keep moving your body to the best of your abilities. You can do this at home, inside or outside, the park, community center, gym or anywhere you like. Consider a physical therapist or personal trainer to get started. Consider the app Sworkit. Fitness trackers such as smart-watches, smart-phones or Fitbits can help as well.   NUTRITION Eat more plants: colorful vegetables, nuts, seeds and berries.  Eat less sugar, salt, preservatives and processed foods.  Avoid toxins such as cigarettes and alcohol.  Drink water when you are thirsty. Warm water with a slice of lemon is an excellent morning drink to start the day.  Consider these websites for more information The Nutrition Source (https://www.henry-hernandez.biz/) Precision Nutrition (WindowBlog.ch)   RELAXATION Consider practicing mindfulness meditation or other relaxation techniques such as deep breathing, prayer, yoga, tai chi, massage. See website mindful.org or the apps Headspace or Calm to help get started.   SLEEP Try to  get at least 7-8+ hours sleep per day. Regular exercise and reduced caffeine will help you sleep better. Practice good sleep hygeine techniques. See website sleep.org for more information.   PLANNING Prepare estate planning, living will, healthcare POA documents. Sometimes this is best planned with the help of an attorney. Theconversationproject.org and agingwithdignity.org are excellent resources.

## 2014-12-19 NOTE — Progress Notes (Signed)
[facility]  PATIENT: Christine Frost DOB: [birth year]  REFERRING CLINICIAN:  HISTORY FROM: patient  REASON FOR VISIT: follow up   HISTORICAL  CHIEF COMPLAINT:  Chief Complaint  Patient presents with  . Follow-up    Room #7  . Peripheral Neuropathy    Reports her symptoms are unchanged.  She still has numbess in her bilateral lower extremities from her calves into feet.  She ambulates slowly but is able to do it without assistance.    HISTORY OF PRESENT ILLNESS:   UPDATE 12/19/14: Since last visit, doing well. No significant change in symptoms. No falls. Occ balance issues when standing in line.   UPDATE 12/18/13 (LL): She returns for annual followup. She tried PT after last visit but did not find it helpful. She again thinks her neuropathy has slightly progressed, but still no pain. She does not use any assistive device and remains very careful with her walking.  UPDATE 12/19/12: Patient presents for followup. She's had neuropathy for past 2 years. She feels like it is progressing. She denies any pain. She just has lack of sensation and poor balance. Numbness involves her feet, toes, ankles, up to her mid shins.  UPDATE 10/17/11: Patient returns for followup. She feels her numb feet are about the same but she feels more off balance at times. She does not use an assistive device but does have a cane. She has not fallen; she claims none of her activities have been curtailed. She remains independent in all activities of daily living. She mows her yard and works in her flowers. She does not have pain. She had not had issues with driving. No problems with bowel or bladder function  UPDATE 10/19/10: She feels her feet remain numb but there is not a marked difference. She does not have numbness in her hands. Her bowel and bladder function are normal. She feels the progression of this disorder is very slow.She has an elevator to her first floor at [employer].  She is independent in activities of daily living. She enjoys being in the yard. She has no difficulty driving. She denies pain. She does not use a walker or a cane.No falls, no problems climbing a ladder. No new neurologic complaints. See ROS  PRIOR HPI: 81 year old right-handed white widowed female with a 7 year history of numbness in her feet secondary to an axonal peripheral neuropathy, causing gait disorder. She developed pain in November 2009 in her left Achilles region that I thought maybe a tendinitis. Doppler study of the venous system 02/15/2008 was unremarkable for evidence of phlebitis. She was seen by Dr. Esperanza Richters and felt to have an Achilles tear and as treated with physical therapy. Her symptoms improved. She fell when her neighbors dog jumped on her 06/13/2008 fracturing her right ankle at the distal fibula. This was a clean break and she was placed in a cam boot. She had good recovery from her right ankle fracture. In the fall 2010 she noticed that her left knee was swollen and painful wthout injury. She had a lateral and medial meniscus tear and underwent surgery 01/06/2009. She notices symptoms when sitting and getting up. She did her rehabilitation at home. A walker was prescribed but she did not need one.   REVIEW OF SYSTEMS: Full 14 system review of systems performed and notable only for only as er HPI.   ALLERGIES: Allergies  Allergen Reactions  . Penicillins Rash  . Pred Forte [Prednisolone Acetate] Other (  See Comments)    OPHTHALMIC - EXCESSIVE TEARING OF EYES    HOME MEDICATIONS: Outpatient Prescriptions Prior to Visit  Medication Sig Dispense Refill  . aspirin 81 MG tablet Take 81 mg by mouth daily.      . Cholecalciferol (VITAMIN D-3) 1000 UNITS CAPS Take 1 capsule by mouth daily.    . Cyanocobalamin 1000 MCG CAPS Take by mouth daily.    Marland Kitchen latanoprost (XALATAN) 0.005 % ophthalmic solution Place 1 drop into both eyes at bedtime.   0  . losartan (COZAAR) 50 MG tablet  TAKE 1 TABLET BY MOUTH DAILY 90 tablet 1  . Multiple Vitamins-Minerals (CENTRUM SILVER PO) Take by mouth.      . Omega-3 Fatty Acids (FISH OIL) 1200 MG CAPS Take 1 capsule by mouth 2 (two) times daily.     Marland Kitchen SYNTHROID 50 MCG tablet TAKE 1 TABLET(50 MCG) BY MOUTH DAILY 90 tablet 1  . Glucosamine HCl 1000 MG TABS Take 1 tablet by mouth daily.     No facility-administered medications prior to visit.    PAST MEDICAL HISTORY: Past Medical History  Diagnosis Date  . Macular degeneration     left eye  . Hypothyroidism   . Peripheral neuropathy (HCC)     lower legs  . Hypertension     states does not have HTN, has been on Losartan since 2007  . History of cellulitis     right leg - finished antibiotic 09/14/2014  . Difficult intubation   . Ductal carcinoma in situ (DCIS) of left breast 07/2014    PAST SURGICAL HISTORY: Past Surgical History  Procedure Laterality Date  . Cataract extraction w/ intraocular lens  implant, bilateral Bilateral   . Pars plana vitrectomy w/ repair of macular hole Left 08/1996 & 12/1996  . Excision morton's neuroma Bilateral 1974  . Pilonidal cyst excision  1953  . Abdominal hysterectomy  age 70    partial  . Cholecystectomy  11/02/2001  . Knee arthroscopy Left 01/06/2009  . Breast lumpectomy with radioactive seed localization Left 09/24/2014    Procedure: LEFT BREAST LUMPECTOMY WITH RADIOACTIVE SEED LOCALIZATION;  Surgeon: Rolm Bookbinder, MD;  Location: [place];  Service: General;  Laterality: Left;    FAMILY HISTORY: Family History  Problem Relation Age of Onset  . Heart disease Mother   . Stroke Mother   . Alcohol abuse Father   . Heart disease Brother   . Hypertension Mother   . Melanoma Daughter     SOCIAL HISTORY:  Social History   Social History  . Marital Status: Widowed    Spouse Name: N/A  . Number of Children: 2  . Years of Education: college   Occupational History  . retired     Therapist, sports   Social History Main  Topics  . Smoking status: Never Smoker   . Smokeless tobacco: Never Used  . Alcohol Use: 0.0 oz/week    0 Standard drinks or equivalent per week     Comment: 1 glass of wine per day  . Drug Use: No  . Sexual Activity: Not on file   Other Topics Concern  . Not on file   Social History Narrative   Patient lives at home alone.   Patient is right handed   Patient has a college education   Caffeine Use: 4 cups daily     PHYSICAL EXAM  GENERAL EXAM/CONSTITUTIONAL: Vitals:  Filed Vitals:   12/19/14 0830  BP: 131/65  Pulse: 73  Height: 5'  3" (1.6 m)  Weight: 114 lb 9.6 oz (51.982 kg)     Body mass index is 20.31 kg/(m^2).  No exam data present  Generalized: Well developed, in no acute distress  Cardiac: Regular rate rhythm, no murmur    NEUROLOGIC: MENTAL STATUS: awake, alert, language fluent, comprehension intact, naming intact CRANIAL NERVE: pupils equal and reactive to light, visual fields full to confrontation, extraocular muscles intact, no nystagmus, facial sensation and strength symmetric, uvula midline, shoulder shrug symmetric, tongue midline. MOTOR: normal bulk and tone, full strength in the BUE; BLE (PROX 5, DF 3, PF 3). SENSORY: VIB AT TOES PRESENT EVEN AFTER VIBRATION HAS STOPPED. DECR PP IN FEET / ANKLES. COORDINATION: finger-nose-finger, fine finger movements normal REFLEXES: BUE 1, KNEES TRACE, ANKLES 0 GAIT/STATION: WIDE BASED STEPPAGE GAIT WITH FOOT SLAPPING BILATERALLY. CANNOT STAND ON TOES OR HEELS. DIFF STANDING FEET TOGETHER EYES OPEN. EVENTUALLY ABLE TO STAND FEET TOGETHER, AND THEN EYES CLOSED. DIFF WITH TURNING     DIAGNOSTIC DATA (LABS, IMAGING, TESTING) - I reviewed patient records, labs, notes, testing and imaging myself where available.  Lab Results  Component Value Date   WBC 4.8 12/18/2014   HGB 14.9 12/18/2014   HCT 43.8 12/18/2014   MCV 91.3 12/18/2014   PLT 225 12/18/2014      Component Value Date/Time   NA 143 12/18/2014  0952   NA 143 09/02/2014 1556   K 4.5 12/18/2014 0952   K 4.1 09/02/2014 1556   CL 105 12/18/2014 0952   CO2 28 12/18/2014 0952   CO2 29 09/02/2014 1556   GLUCOSE 83 12/18/2014 0952   GLUCOSE 86 09/02/2014 1556   BUN 13 12/18/2014 0952   BUN 13.1 09/02/2014 1556   CREATININE 0.62 12/18/2014 0952   CREATININE 0.7 09/02/2014 1556   CREATININE 0.69 01/02/2009 0906   CALCIUM 9.7 12/18/2014 0952   CALCIUM 9.6 09/02/2014 1556   PROT 6.6 12/18/2014 0952   PROT 6.8 09/02/2014 1556   ALBUMIN 4.3 12/18/2014 0952   ALBUMIN 4.0 09/02/2014 1556   AST 20 12/18/2014 0952   AST 22 09/02/2014 1556   ALT 19 12/18/2014 0952   ALT 23 09/02/2014 1556   ALKPHOS 55 12/18/2014 0952   ALKPHOS 60 09/02/2014 1556   BILITOT 0.6 12/18/2014 0952   BILITOT 0.30 09/02/2014 1556   GFRNONAA 85 12/18/2014 0952   GFRNONAA >60 01/02/2009 0906   GFRAA >89 12/18/2014 0952   GFRAA  01/02/2009 0906    >60        The eGFR has been calculated using the MDRD equation. This calculation has not been validated in all clinical situations. eGFR's persistently <60 mL/min signify possible Chronic Kidney Disease.   Lab Results  Component Value Date   CHOL 187 12/18/2014   HDL 72 12/18/2014   LDLCALC 99 12/18/2014   TRIG 79 12/18/2014   CHOLHDL 2.6 12/18/2014   No results found for: HGBA1C No results found for: VITAMINB12 Lab Results  Component Value Date   TSH 1.544 12/18/2014      ASSESSMENT AND PLAN  81 y.o. year old female here with female here with idiopathic neuropathy. Continues to have problems with balance and gait difficulty. No pain fortunately. No falls.  Dx: idiopathic neuropathy   PLAN: - return to PCP - caution with balance and walking  Return if symptoms worsen or fail to improve, for return to PCP.    Penni Bombard, MD 93/81/0175, 1:02 AM Certified in Neurology, Neurophysiology and Neuroimaging  [facility]  [address] [place], [place]  [address] [phone]

## 2015-01-05 ENCOUNTER — Encounter: Payer: Self-pay | Admitting: Nurse Practitioner

## 2015-01-05 DIAGNOSIS — D0512 Intraductal carcinoma in situ of left breast: Secondary | ICD-10-CM

## 2015-01-05 DIAGNOSIS — C50912 Malignant neoplasm of unspecified site of left female breast: Secondary | ICD-10-CM | POA: Insufficient documentation

## 2015-01-05 NOTE — Progress Notes (Signed)
The Survivorship Care Plan was mailed to Christine Frost as she reported not being able to come in to the Survivorship Clinic for an in-person visit at this time. A letter was mailed to her outlining the purpose of the content of the care plan, as well as encouraging her to reach out to me with any questions or concerns.  My business card was included in the correspondence to the patient as well.  A copy of the care plan was also routed/faxed/mailed to Christine Showers, Christine Frost, the patient's PCP.  I will not be placing any follow-up appointments to the Survivorship Clinic for Christine Frost, but I am happy to see her at any time in the future for any survivorship concerns that may arise. Thank you for allowing me to participate in her care!  Kenn File, [place] [phone]

## 2015-01-27 ENCOUNTER — Other Ambulatory Visit: Payer: Self-pay | Admitting: Internal Medicine

## 2015-02-06 DIAGNOSIS — H04123 Dry eye syndrome of bilateral lacrimal glands: Secondary | ICD-10-CM | POA: Diagnosis not present

## 2015-02-06 DIAGNOSIS — H401122 Primary open-angle glaucoma, left eye, moderate stage: Secondary | ICD-10-CM | POA: Diagnosis not present

## 2015-03-08 NOTE — Patient Instructions (Signed)
It was a pleasure to see you today. Continue same medications and return in one year. 
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
## 2015-05-07 ENCOUNTER — Other Ambulatory Visit: Payer: Self-pay | Admitting: Internal Medicine

## 2015-05-25 DIAGNOSIS — H35342 Macular cyst, hole, or pseudohole, left eye: Secondary | ICD-10-CM | POA: Diagnosis not present

## 2015-05-25 DIAGNOSIS — H353131 Nonexudative age-related macular degeneration, bilateral, early dry stage: Secondary | ICD-10-CM | POA: Diagnosis not present

## 2015-05-25 DIAGNOSIS — H40012 Open angle with borderline findings, low risk, left eye: Secondary | ICD-10-CM | POA: Diagnosis not present

## 2015-05-27 ENCOUNTER — Other Ambulatory Visit: Payer: Self-pay | Admitting: Internal Medicine

## 2015-05-28 DIAGNOSIS — D0512 Intraductal carcinoma in situ of left breast: Secondary | ICD-10-CM | POA: Diagnosis not present

## 2015-06-12 DIAGNOSIS — H04123 Dry eye syndrome of bilateral lacrimal glands: Secondary | ICD-10-CM | POA: Diagnosis not present

## 2015-06-12 DIAGNOSIS — H401122 Primary open-angle glaucoma, left eye, moderate stage: Secondary | ICD-10-CM | POA: Diagnosis not present

## 2015-06-12 DIAGNOSIS — H40021 Open angle with borderline findings, high risk, right eye: Secondary | ICD-10-CM | POA: Diagnosis not present

## 2015-06-12 DIAGNOSIS — H35373 Puckering of macula, bilateral: Secondary | ICD-10-CM | POA: Diagnosis not present

## 2015-06-16 ENCOUNTER — Other Ambulatory Visit: Payer: Self-pay | Admitting: General Surgery

## 2015-06-16 DIAGNOSIS — Z853 Personal history of malignant neoplasm of breast: Secondary | ICD-10-CM

## 2015-06-23 DIAGNOSIS — H04123 Dry eye syndrome of bilateral lacrimal glands: Secondary | ICD-10-CM | POA: Diagnosis not present

## 2015-06-23 DIAGNOSIS — H35373 Puckering of macula, bilateral: Secondary | ICD-10-CM | POA: Diagnosis not present

## 2015-06-23 DIAGNOSIS — H401122 Primary open-angle glaucoma, left eye, moderate stage: Secondary | ICD-10-CM | POA: Diagnosis not present

## 2015-06-23 DIAGNOSIS — H40021 Open angle with borderline findings, high risk, right eye: Secondary | ICD-10-CM | POA: Diagnosis not present

## 2015-07-15 ENCOUNTER — Ambulatory Visit
Admission: RE | Admit: 2015-07-15 | Discharge: 2015-07-15 | Disposition: A | Discharge status: Home / Self Care | Payer: Medicare Other | Source: Ambulatory Visit | Attending: General Surgery | Admitting: General Surgery

## 2015-07-15 DIAGNOSIS — R922 Inconclusive mammogram: Secondary | ICD-10-CM | POA: Diagnosis not present

## 2015-07-15 DIAGNOSIS — Z853 Personal history of malignant neoplasm of breast: Secondary | ICD-10-CM

## 2015-09-02 ENCOUNTER — Other Ambulatory Visit: Payer: Self-pay | Admitting: Internal Medicine

## 2015-09-25 IMAGING — US US BREAST LTD UNI LEFT INC AXILLA
1 series · 9 of 9 positions shown · non-contrast
Comparison: Previous exams.

CLINICAL DATA: Screening recall for a possible left breast
distortion.

EXAM:
DIGITAL DIAGNOSTIC LEFT MAMMOGRAM WITH 3D TOMOSYNTHESIS AND CAD
LEFT BREAST ULTRASOUND

[Series 1: us breast ltd uni left inc axilla · 0.06mm/px · 9 of 9 slices shown]
[im 1/9]
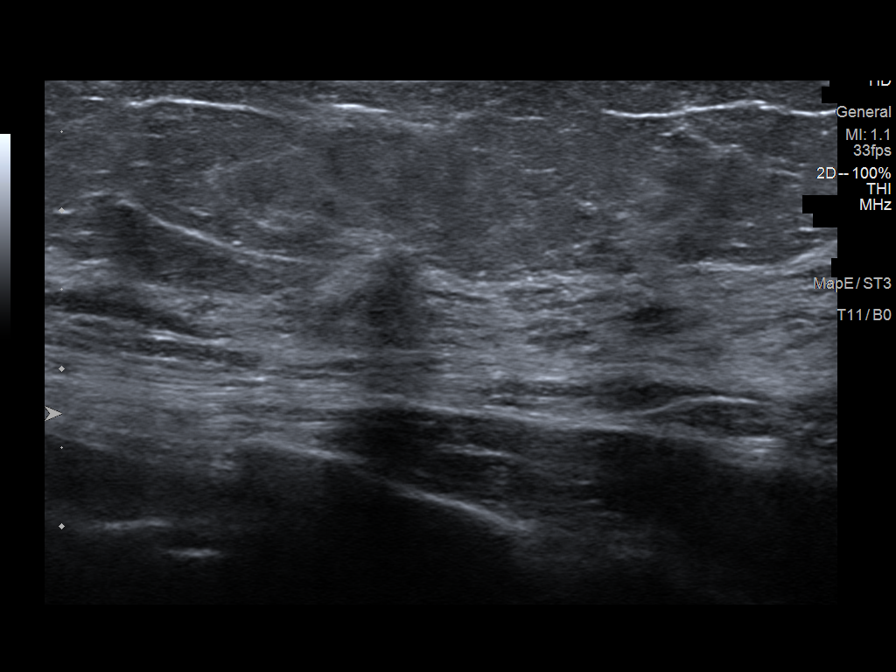
[im 2/9]
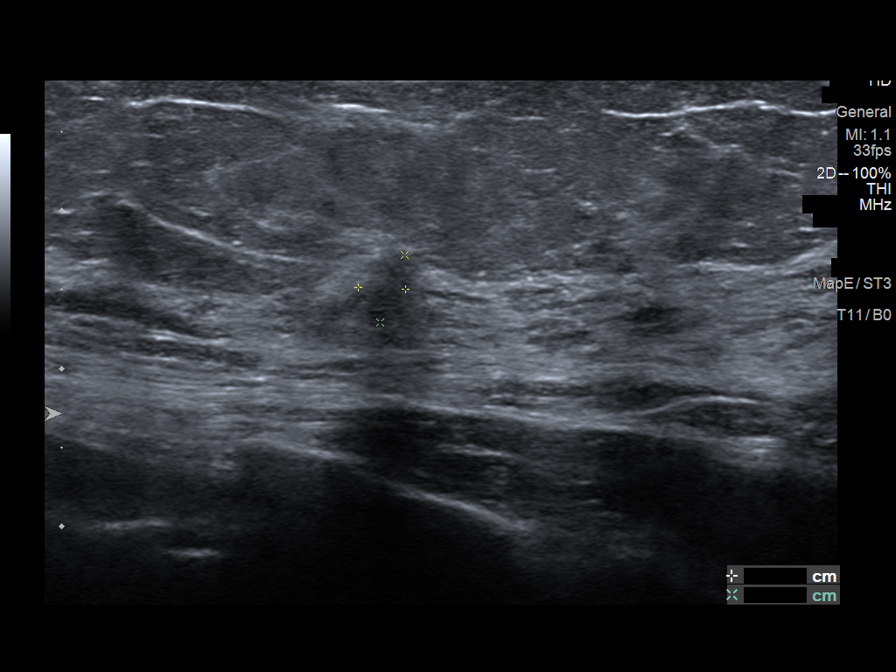
[im 3/9]
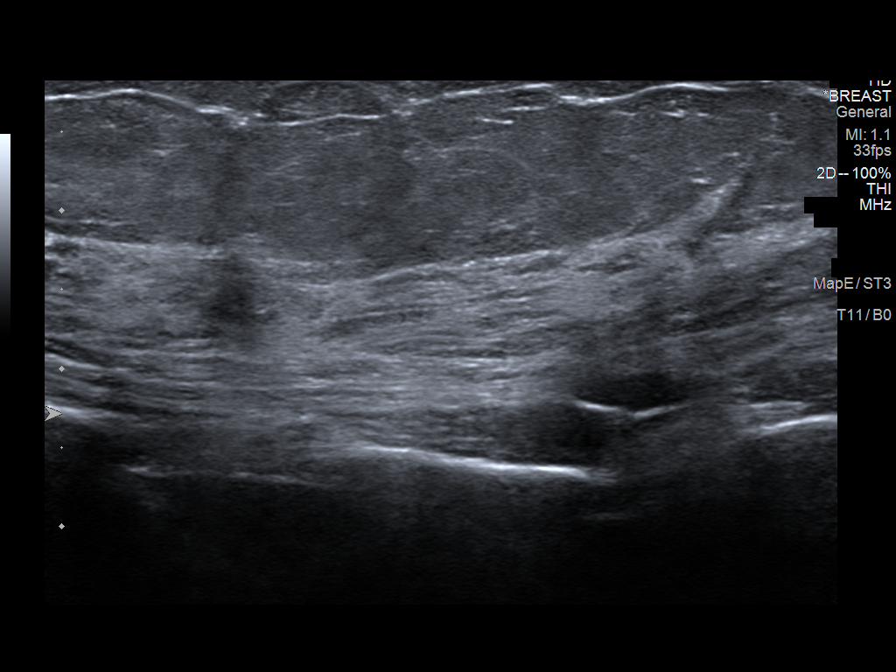
[im 4/9]
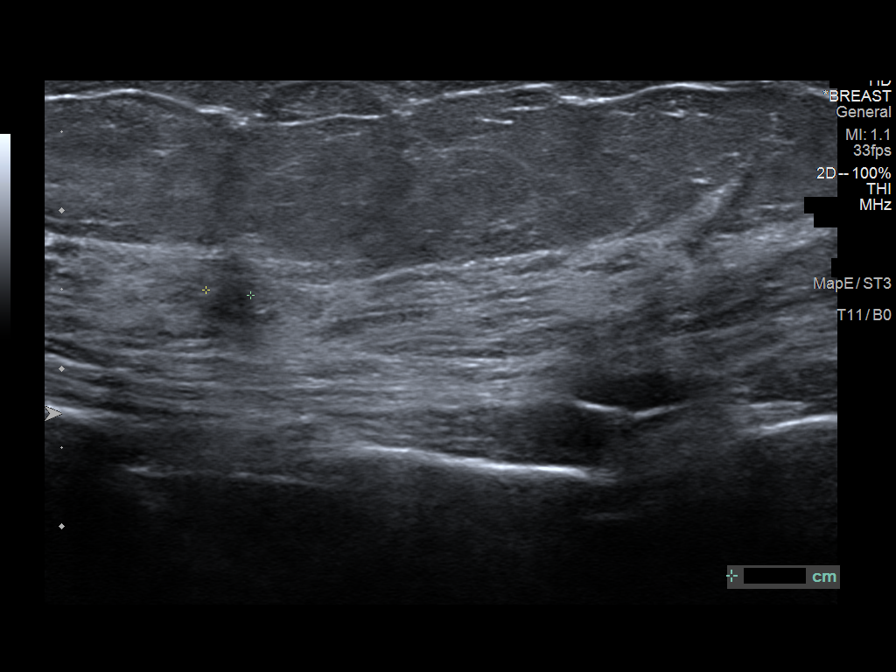
[im 5/9]
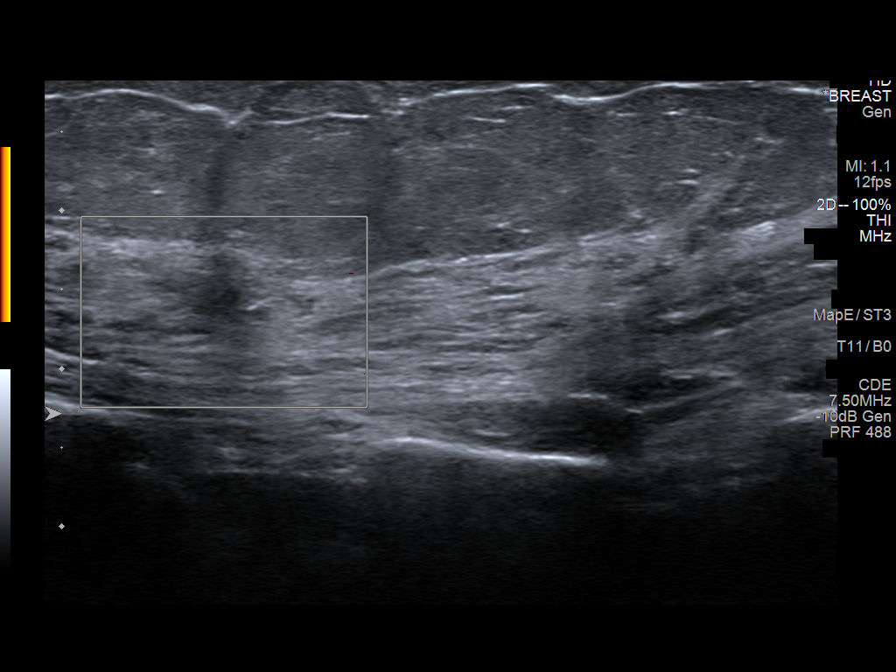
[im 6/9]
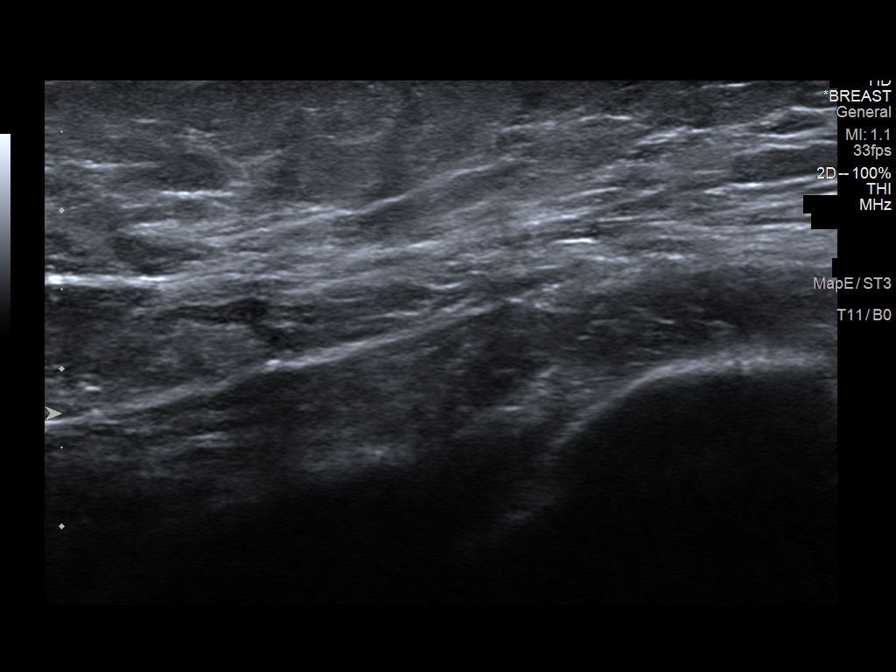
[im 7/9]
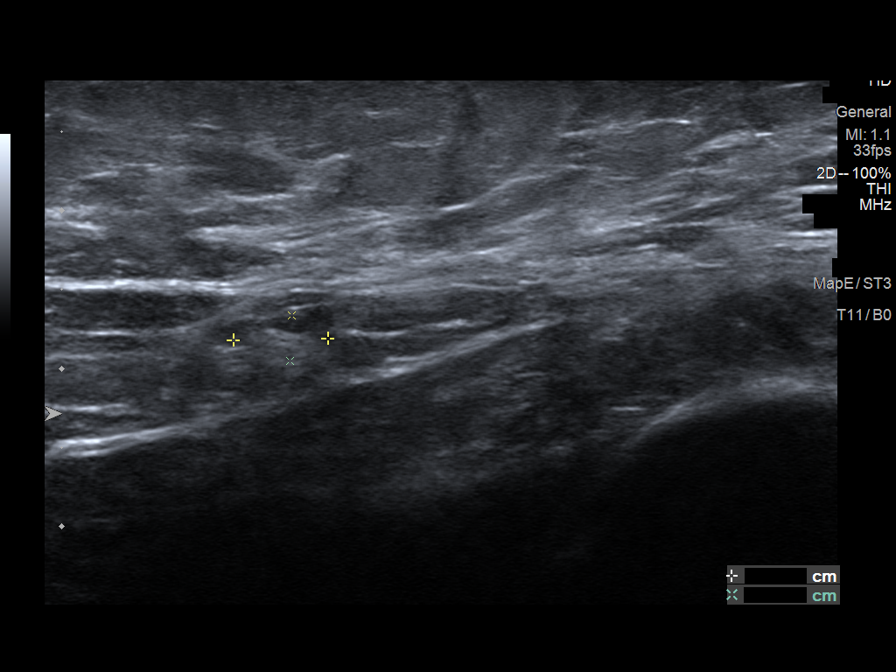
[im 8/9]
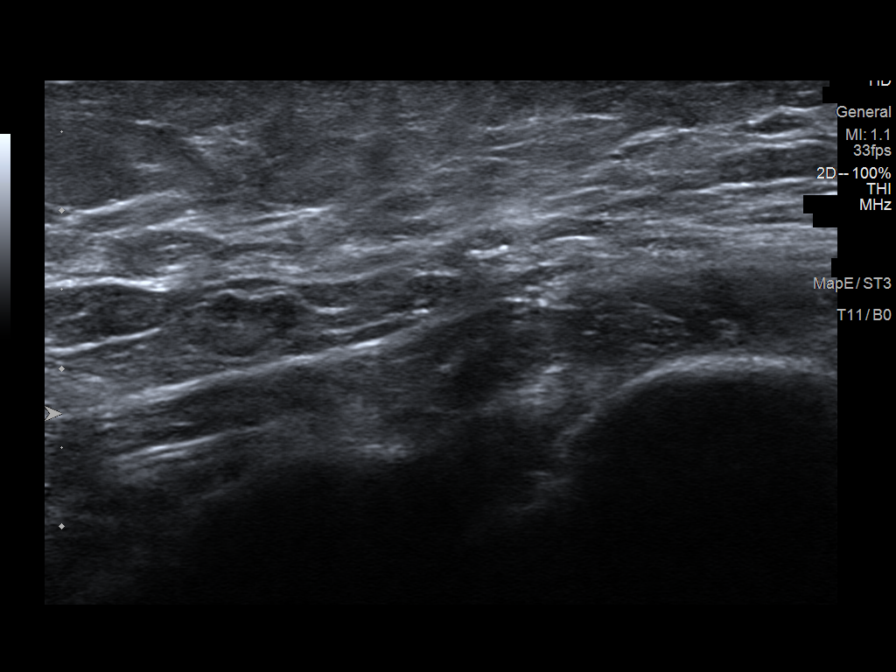
[im 9/9]
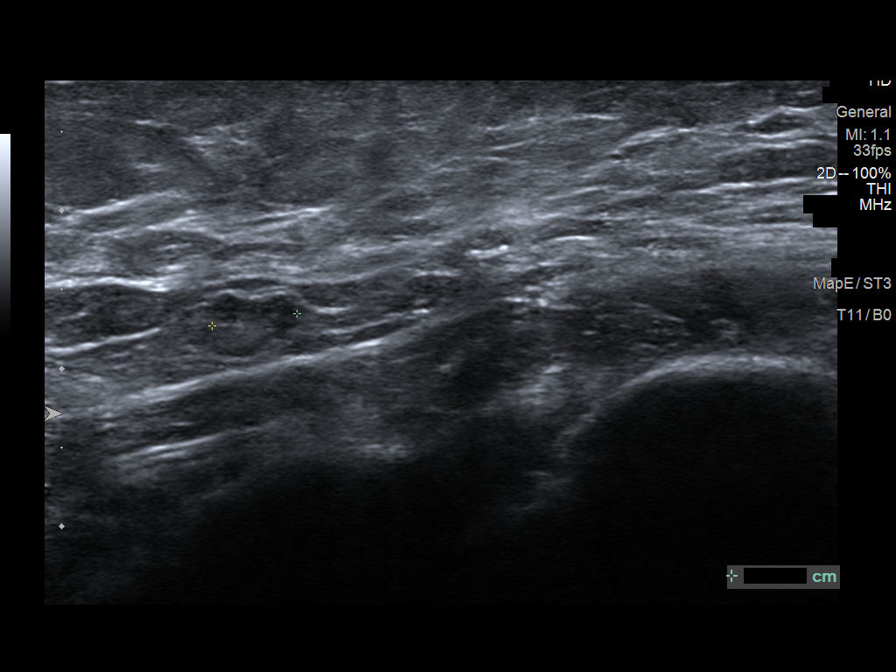

[9 of 9 positions shown; findings below may reference images not displayed]

ACR Breast Density Category c: The breast tissue is heterogeneously
dense, which may obscure small masses.
FINDINGS: Spot compression CC and MLO views were performed over the central
left breast demonstrating a persistent area of distortion in the
central to slightly inner left breast.

Mammographic images were processed with CAD.

Physical examination of the central to inner left breast does not
reveal a any palpable masses.

Targeted ultrasound of the left breast was performed demonstrating a
vague irregular shadowing mass at 12 o'clock 1 cm from the nipple
measuring 0.3 x 0.5 x 0.3 cm. This may but not definitely correspond
with the area of distortion seen on mammography. No lymphadenopathy
seen in the left axilla.
IMPRESSION: Suspicious left breast distortion. A possible but not definite
correlate is seen on targeted ultrasound.

RECOMMENDATION:
Stereotactic guided biopsy with tomosynthesis using the [facility]
biopsy unit is recommended. This is scheduled for [REDACTED] [REDACTED] at 9 a.m..

I have discussed the findings and recommendations with the patient.
Results were also provided in writing at the conclusion of the
visit. If applicable, a reminder letter will be sent to the patient
regarding the next appointment.

BI-RADS CATEGORY  4: Suspicious.
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
## 2015-11-02 ENCOUNTER — Encounter: Payer: Self-pay | Admitting: Oncology

## 2015-11-02 ENCOUNTER — Ambulatory Visit (HOSPITAL_BASED_OUTPATIENT_CLINIC_OR_DEPARTMENT_OTHER): Payer: Medicare Other | Admitting: Oncology

## 2015-11-02 VITALS — BP 127/63 | HR 74 | Temp 98.4°F | Resp 18 | Ht 63.0 in | Wt 109.7 lb

## 2015-11-02 DIAGNOSIS — D0512 Intraductal carcinoma in situ of left breast: Secondary | ICD-10-CM | POA: Diagnosis not present

## 2015-11-02 DIAGNOSIS — C50912 Malignant neoplasm of unspecified site of left female breast: Secondary | ICD-10-CM

## 2015-12-14 DIAGNOSIS — H35373 Puckering of macula, bilateral: Secondary | ICD-10-CM | POA: Diagnosis not present

## 2015-12-14 DIAGNOSIS — H401122 Primary open-angle glaucoma, left eye, moderate stage: Secondary | ICD-10-CM | POA: Diagnosis not present

## 2015-12-14 DIAGNOSIS — M25552 Pain in left hip: Secondary | ICD-10-CM | POA: Diagnosis not present

## 2015-12-14 DIAGNOSIS — H04123 Dry eye syndrome of bilateral lacrimal glands: Secondary | ICD-10-CM | POA: Diagnosis not present

## 2015-12-14 DIAGNOSIS — H40021 Open angle with borderline findings, high risk, right eye: Secondary | ICD-10-CM | POA: Diagnosis not present

## 2015-12-17 ENCOUNTER — Other Ambulatory Visit: Payer: Self-pay | Admitting: Internal Medicine

## 2015-12-22 ENCOUNTER — Other Ambulatory Visit: Payer: Medicare Other | Admitting: Internal Medicine

## 2015-12-22 DIAGNOSIS — E559 Vitamin D deficiency, unspecified: Secondary | ICD-10-CM | POA: Diagnosis not present

## 2015-12-22 DIAGNOSIS — R52 Pain, unspecified: Secondary | ICD-10-CM | POA: Diagnosis not present

## 2015-12-22 DIAGNOSIS — E039 Hypothyroidism, unspecified: Secondary | ICD-10-CM | POA: Diagnosis not present

## 2015-12-22 DIAGNOSIS — E7800 Pure hypercholesterolemia, unspecified: Secondary | ICD-10-CM | POA: Diagnosis not present

## 2015-12-22 LAB — COMPLETE METABOLIC PANEL WITH GFR
ALBUMIN: 4.3 g/dL (ref 3.6–5.1)
ALK PHOS: 46 U/L (ref 33–130)
ALT: 19 U/L (ref 6–29)
AST: 17 U/L (ref 10–35)
BILIRUBIN TOTAL: 0.6 mg/dL (ref 0.2–1.2)
BUN: 10 mg/dL (ref 7–25)
CO2: 33 mmol/L — AB (ref 20–31)
CREATININE: 0.63 mg/dL (ref 0.60–0.88)
Calcium: 9.8 mg/dL (ref 8.6–10.4)
Chloride: 104 mmol/L (ref 98–110)
GFR, EST NON AFRICAN AMERICAN: 83 mL/min (ref >=60)
GFR, Est African American: >89 mL/min (ref >=60)
GLUCOSE: 82 mg/dL (ref 65–99)
Potassium: 4.6 mmol/L (ref 3.5–5.3)
SODIUM: 142 mmol/L (ref 135–146)
TOTAL PROTEIN: 6.5 g/dL (ref 6.1–8.1)

## 2015-12-22 LAB — LIPID PANEL
Cholesterol: 200 mg/dL — ABNORMAL HIGH (ref <200)
HDL: 86 mg/dL (ref >50)
LDL CALC: 89 mg/dL (ref <100)
Total CHOL/HDL Ratio: 2.3 Ratio (ref <5.0)
Triglycerides: 126 mg/dL (ref <150)
VLDL: 25 mg/dL (ref <30)

## 2015-12-22 LAB — CBC WITH DIFFERENTIAL/PLATELET
BASOS ABS: 58 {cells}/uL (ref 0–200)
BASOS PCT: 1 %
EOS ABS: 232 {cells}/uL (ref 15–500)
EOS PCT: 4 %
HCT: 44.2 % (ref 35.0–45.0)
HEMOGLOBIN: 14.8 g/dL (ref 11.7–15.5)
LYMPHS ABS: 1334 {cells}/uL (ref 850–3900)
Lymphocytes Relative: 23 %
MCH: 31 pg (ref 27.0–33.0)
MCHC: 33.5 g/dL (ref 32.0–36.0)
MCV: 92.5 fL (ref 80.0–100.0)
MPV: 8.7 fL (ref 7.5–12.5)
Monocytes Absolute: 464 cells/uL (ref 200–950)
Monocytes Relative: 8 %
NEUTROS ABS: 3712 {cells}/uL (ref 1500–7800)
Neutrophils Relative %: 64 %
Platelets: 216 10*3/uL (ref 140–400)
RBC: 4.78 MIL/uL (ref 3.80–5.10)
RDW: 14.2 % (ref 11.0–15.0)
WBC: 5.8 10*3/uL (ref 3.8–10.8)

## 2015-12-22 LAB — TSH: TSH: 1.58 m[IU]/L

## 2015-12-23 LAB — VITAMIN D 25 HYDROXY (VIT D DEFICIENCY, FRACTURES): VIT D 25 HYDROXY: 45 ng/mL (ref 30–100)

## 2015-12-25 ENCOUNTER — Ambulatory Visit (INDEPENDENT_AMBULATORY_CARE_PROVIDER_SITE_OTHER): Payer: Medicare Other | Admitting: Internal Medicine

## 2015-12-25 ENCOUNTER — Encounter: Payer: Self-pay | Admitting: Internal Medicine

## 2015-12-25 VITALS — BP 122/70 | HR 81 | Temp 98.1°F | Ht 63.0 in | Wt 109.0 lb

## 2015-12-25 DIAGNOSIS — E039 Hypothyroidism, unspecified: Secondary | ICD-10-CM | POA: Diagnosis not present

## 2015-12-25 DIAGNOSIS — D0512 Intraductal carcinoma in situ of left breast: Secondary | ICD-10-CM

## 2015-12-25 DIAGNOSIS — R319 Hematuria, unspecified: Secondary | ICD-10-CM

## 2015-12-25 DIAGNOSIS — Z0000 Encounter for general adult medical examination without abnormal findings: Secondary | ICD-10-CM

## 2015-12-25 DIAGNOSIS — G609 Hereditary and idiopathic neuropathy, unspecified: Secondary | ICD-10-CM | POA: Diagnosis not present

## 2015-12-25 DIAGNOSIS — I10 Essential (primary) hypertension: Secondary | ICD-10-CM

## 2015-12-25 LAB — POCT URINALYSIS DIPSTICK
Bilirubin, UA: NEGATIVE
Glucose, UA: NEGATIVE
KETONES UA: NEGATIVE
LEUKOCYTES UA: NEGATIVE
Nitrite, UA: NEGATIVE
PH UA: 6.5
PROTEIN UA: NEGATIVE
Spec Grav, UA: 1.015
Urobilinogen, UA: NEGATIVE
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
## 2015-12-26 LAB — URINALYSIS, MICROSCOPIC ONLY: Yeast: NONE SEEN [HPF]

## 2015-12-26 LAB — URINE CULTURE

## 2016-01-04 NOTE — Patient Instructions (Signed)
It was a pleasure to see you today. Continue same medications and return in one year or as needed. 

## 2016-01-06 DIAGNOSIS — M25552 Pain in left hip: Secondary | ICD-10-CM | POA: Diagnosis not present

## 2016-01-11 DIAGNOSIS — H40021 Open angle with borderline findings, high risk, right eye: Secondary | ICD-10-CM | POA: Diagnosis not present

## 2016-01-11 DIAGNOSIS — H04123 Dry eye syndrome of bilateral lacrimal glands: Secondary | ICD-10-CM | POA: Diagnosis not present

## 2016-01-11 DIAGNOSIS — M25552 Pain in left hip: Secondary | ICD-10-CM | POA: Diagnosis not present

## 2016-01-11 DIAGNOSIS — H35373 Puckering of macula, bilateral: Secondary | ICD-10-CM | POA: Diagnosis not present

## 2016-01-11 DIAGNOSIS — H401122 Primary open-angle glaucoma, left eye, moderate stage: Secondary | ICD-10-CM | POA: Diagnosis not present

## 2016-01-14 ENCOUNTER — Other Ambulatory Visit: Payer: Self-pay

## 2016-02-15 DIAGNOSIS — H04123 Dry eye syndrome of bilateral lacrimal glands: Secondary | ICD-10-CM | POA: Diagnosis not present

## 2016-02-15 DIAGNOSIS — H35373 Puckering of macula, bilateral: Secondary | ICD-10-CM | POA: Diagnosis not present

## 2016-02-15 DIAGNOSIS — H401122 Primary open-angle glaucoma, left eye, moderate stage: Secondary | ICD-10-CM | POA: Diagnosis not present

## 2016-02-15 DIAGNOSIS — H40021 Open angle with borderline findings, high risk, right eye: Secondary | ICD-10-CM | POA: Diagnosis not present

## 2016-03-17 ENCOUNTER — Other Ambulatory Visit: Payer: Self-pay | Admitting: Internal Medicine

## 2016-04-07 DIAGNOSIS — H401122 Primary open-angle glaucoma, left eye, moderate stage: Secondary | ICD-10-CM | POA: Diagnosis not present

## 2016-04-07 DIAGNOSIS — H04123 Dry eye syndrome of bilateral lacrimal glands: Secondary | ICD-10-CM | POA: Diagnosis not present

## 2016-04-07 DIAGNOSIS — H524 Presbyopia: Secondary | ICD-10-CM | POA: Diagnosis not present

## 2016-04-07 DIAGNOSIS — H40021 Open angle with borderline findings, high risk, right eye: Secondary | ICD-10-CM | POA: Diagnosis not present

## 2016-04-07 DIAGNOSIS — H35373 Puckering of macula, bilateral: Secondary | ICD-10-CM | POA: Diagnosis not present

## 2016-05-25 DIAGNOSIS — H401112 Primary open-angle glaucoma, right eye, moderate stage: Secondary | ICD-10-CM | POA: Diagnosis not present

## 2016-05-25 DIAGNOSIS — H35342 Macular cyst, hole, or pseudohole, left eye: Secondary | ICD-10-CM | POA: Diagnosis not present

## 2016-05-25 DIAGNOSIS — H353131 Nonexudative age-related macular degeneration, bilateral, early dry stage: Secondary | ICD-10-CM | POA: Diagnosis not present

## 2016-05-25 DIAGNOSIS — H401122 Primary open-angle glaucoma, left eye, moderate stage: Secondary | ICD-10-CM | POA: Diagnosis not present

## 2016-05-27 ENCOUNTER — Other Ambulatory Visit: Payer: Self-pay | Admitting: General Surgery

## 2016-05-27 DIAGNOSIS — D0512 Intraductal carcinoma in situ of left breast: Secondary | ICD-10-CM | POA: Diagnosis not present

## 2016-05-27 DIAGNOSIS — Z9889 Other specified postprocedural states: Secondary | ICD-10-CM

## 2016-05-27 DIAGNOSIS — Z853 Personal history of malignant neoplasm of breast: Secondary | ICD-10-CM

## 2016-08-01 ENCOUNTER — Other Ambulatory Visit: Payer: Self-pay | Admitting: Internal Medicine

## 2016-08-04 ENCOUNTER — Ambulatory Visit
Admission: RE | Admit: 2016-08-04 | Discharge: 2016-08-04 | Disposition: A | Discharge status: Home / Self Care | Payer: Medicare Other | Source: Ambulatory Visit | Attending: General Surgery | Admitting: General Surgery

## 2016-08-04 DIAGNOSIS — R928 Other abnormal and inconclusive findings on diagnostic imaging of breast: Secondary | ICD-10-CM | POA: Diagnosis not present

## 2016-08-04 DIAGNOSIS — Z853 Personal history of malignant neoplasm of breast: Secondary | ICD-10-CM

## 2016-08-04 DIAGNOSIS — Z9889 Other specified postprocedural states: Secondary | ICD-10-CM

## 2016-09-01 DIAGNOSIS — H40021 Open angle with borderline findings, high risk, right eye: Secondary | ICD-10-CM | POA: Diagnosis not present

## 2016-09-01 DIAGNOSIS — H524 Presbyopia: Secondary | ICD-10-CM | POA: Diagnosis not present

## 2016-09-01 DIAGNOSIS — H401122 Primary open-angle glaucoma, left eye, moderate stage: Secondary | ICD-10-CM | POA: Diagnosis not present

## 2016-09-01 DIAGNOSIS — H35373 Puckering of macula, bilateral: Secondary | ICD-10-CM | POA: Diagnosis not present

## 2016-09-01 DIAGNOSIS — H04123 Dry eye syndrome of bilateral lacrimal glands: Secondary | ICD-10-CM | POA: Diagnosis not present

## 2016-12-22 ENCOUNTER — Other Ambulatory Visit: Payer: Medicare Other | Admitting: Internal Medicine

## 2016-12-22 DIAGNOSIS — I10 Essential (primary) hypertension: Secondary | ICD-10-CM

## 2016-12-22 DIAGNOSIS — E039 Hypothyroidism, unspecified: Secondary | ICD-10-CM

## 2016-12-22 DIAGNOSIS — D0512 Intraductal carcinoma in situ of left breast: Secondary | ICD-10-CM

## 2016-12-22 DIAGNOSIS — E785 Hyperlipidemia, unspecified: Secondary | ICD-10-CM

## 2016-12-22 DIAGNOSIS — Z0000 Encounter for general adult medical examination without abnormal findings: Secondary | ICD-10-CM

## 2016-12-22 LAB — LIPID PANEL
CHOL/HDL RATIO: 2.4 (calc) (ref <5.0)
CHOLESTEROL: 188 mg/dL (ref <200)
HDL: 80 mg/dL (ref >50)
LDL CHOLESTEROL (CALC): 89 mg/dL
NON-HDL CHOLESTEROL (CALC): 108 mg/dL (ref <130)
TRIGLYCERIDES: 99 mg/dL (ref <150)

## 2016-12-22 LAB — COMPLETE METABOLIC PANEL WITH GFR
AG Ratio: 1.8 (calc) (ref 1.0–2.5)
ALBUMIN MSPROF: 4.1 g/dL (ref 3.6–5.1)
ALKALINE PHOSPHATASE (APISO): 51 U/L (ref 33–130)
ALT: 20 U/L (ref 6–29)
AST: 20 U/L (ref 10–35)
BUN: 16 mg/dL (ref 7–25)
CO2: 30 mmol/L (ref 20–32)
CREATININE: 0.67 mg/dL (ref 0.60–0.88)
Calcium: 9.8 mg/dL (ref 8.6–10.4)
Chloride: 106 mmol/L (ref 98–110)
GFR, EST NON AFRICAN AMERICAN: 81 mL/min/{1.73_m2} (ref >=60)
GFR, Est African American: 94 mL/min/{1.73_m2} (ref >=60)
GLOBULIN: 2.3 g/dL (ref 1.9–3.7)
Glucose, Bld: 78 mg/dL (ref 65–99)
Potassium: 5.1 mmol/L (ref 3.5–5.3)
SODIUM: 144 mmol/L (ref 135–146)
Total Bilirubin: 0.6 mg/dL (ref 0.2–1.2)
Total Protein: 6.4 g/dL (ref 6.1–8.1)

## 2016-12-22 LAB — CBC WITH DIFFERENTIAL/PLATELET
BASOS ABS: 52 {cells}/uL (ref 0–200)
Basophils Relative: 1.1 %
EOS ABS: 132 {cells}/uL (ref 15–500)
EOS PCT: 2.8 %
HEMATOCRIT: 40.3 % (ref 35.0–45.0)
Hemoglobin: 14 g/dL (ref 11.7–15.5)
Lymphs Abs: 1344 cells/uL (ref 850–3900)
MCH: 31.7 pg (ref 27.0–33.0)
MCHC: 34.7 g/dL (ref 32.0–36.0)
MCV: 91.2 fL (ref 80.0–100.0)
MPV: 9.2 fL (ref 7.5–12.5)
Monocytes Relative: 9.7 %
NEUTROS ABS: 2717 {cells}/uL (ref 1500–7800)
Neutrophils Relative %: 57.8 %
Platelets: 204 10*3/uL (ref 140–400)
RBC: 4.42 10*6/uL (ref 3.80–5.10)
RDW: 13 % (ref 11.0–15.0)
Total Lymphocyte: 28.6 %
WBC: 4.7 10*3/uL (ref 3.8–10.8)
WBCMIX: 456 {cells}/uL (ref 200–950)

## 2016-12-22 LAB — TSH: TSH: 2.13 mIU/L (ref 0.40–4.50)

## 2016-12-26 ENCOUNTER — Encounter: Payer: Self-pay | Admitting: Internal Medicine

## 2016-12-26 ENCOUNTER — Ambulatory Visit (INDEPENDENT_AMBULATORY_CARE_PROVIDER_SITE_OTHER): Payer: Medicare Other | Admitting: Internal Medicine

## 2016-12-26 VITALS — BP 142/80 | HR 71 | Temp 97.9°F | Ht 63.0 in | Wt 114.0 lb

## 2016-12-26 DIAGNOSIS — Z0000 Encounter for general adult medical examination without abnormal findings: Secondary | ICD-10-CM | POA: Diagnosis not present

## 2016-12-26 DIAGNOSIS — G609 Hereditary and idiopathic neuropathy, unspecified: Secondary | ICD-10-CM

## 2016-12-26 DIAGNOSIS — R030 Elevated blood-pressure reading, without diagnosis of hypertension: Secondary | ICD-10-CM

## 2016-12-26 DIAGNOSIS — E039 Hypothyroidism, unspecified: Secondary | ICD-10-CM

## 2016-12-26 DIAGNOSIS — I10 Essential (primary) hypertension: Secondary | ICD-10-CM | POA: Diagnosis not present

## 2016-12-26 DIAGNOSIS — D0512 Intraductal carcinoma in situ of left breast: Secondary | ICD-10-CM | POA: Diagnosis not present

## 2016-12-26 LAB — POCT URINALYSIS DIPSTICK
BILIRUBIN UA: NEGATIVE
GLUCOSE UA: NEGATIVE
KETONES UA: NEGATIVE
Leukocytes, UA: NEGATIVE
Nitrite, UA: NEGATIVE
Protein, UA: NEGATIVE
RBC UA: NEGATIVE
SPEC GRAV UA: 1.025 (ref 1.010–1.025)
UROBILINOGEN UA: 0.2 U/dL
pH, UA: 6.5 (ref 5.0–8.0)

## 2016-12-26 NOTE — Patient Instructions (Signed)
Return in January for repeat blood pressure check.  Continue same medications.
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
## 2017-01-12 ENCOUNTER — Other Ambulatory Visit: Payer: Self-pay

## 2017-02-09 ENCOUNTER — Other Ambulatory Visit: Payer: Self-pay | Admitting: Internal Medicine

## 2017-03-16 DIAGNOSIS — H35373 Puckering of macula, bilateral: Secondary | ICD-10-CM | POA: Diagnosis not present

## 2017-03-16 DIAGNOSIS — H524 Presbyopia: Secondary | ICD-10-CM | POA: Diagnosis not present

## 2017-03-16 DIAGNOSIS — H04123 Dry eye syndrome of bilateral lacrimal glands: Secondary | ICD-10-CM | POA: Diagnosis not present

## 2017-03-16 DIAGNOSIS — H40021 Open angle with borderline findings, high risk, right eye: Secondary | ICD-10-CM | POA: Diagnosis not present

## 2017-03-16 DIAGNOSIS — H401122 Primary open-angle glaucoma, left eye, moderate stage: Secondary | ICD-10-CM | POA: Diagnosis not present

## 2017-03-17 ENCOUNTER — Ambulatory Visit (INDEPENDENT_AMBULATORY_CARE_PROVIDER_SITE_OTHER): Payer: Medicare Other | Admitting: Internal Medicine

## 2017-03-17 ENCOUNTER — Encounter: Payer: Self-pay | Admitting: Internal Medicine

## 2017-03-17 VITALS — BP 142/80 | HR 73 | Ht 63.0 in | Wt 118.0 lb

## 2017-03-17 DIAGNOSIS — I10 Essential (primary) hypertension: Secondary | ICD-10-CM | POA: Diagnosis not present

## 2017-03-17 NOTE — Patient Instructions (Signed)
It was a pleasure to see you today.  Continue same medications and return in November for physical exam

## 2017-03-17 NOTE — Progress Notes (Signed)
   Subjective:    Patient ID: Christine Frost, female    DOB: Dec 19, 1930, 84 y.o.   MRN: [id number]  HPI 84 year old Female in today for follow-up blood pressure check.  She was here in November for health maintenance exam and blood pressure was elevated at 142/80.  We brought her back to see if it had improved.  She has taken her medication.  She is compliant with it.  She does not check her blood pressure at home.    Review of Systems see above     Objective:   Physical Exam  Initial blood pressure on arrival 142/80 and rechecked value was 140/80      Assessment & Plan:  Essential hypertension tends to be a bit higher think in the office -thinks she may have a little bit of office hypertension.  However she is stable on this regimen and tolerates it well.  She is getting along well.  Has no new.  Is very active.  I think she may be getting a little forgetful but otherwise doing well.  She resides alone.  Plan: She will return in November for physical exam or sooner if necessary.

## 2017-04-27 ENCOUNTER — Other Ambulatory Visit: Payer: Self-pay | Admitting: Internal Medicine

## 2017-05-22 ENCOUNTER — Other Ambulatory Visit: Payer: Self-pay | Admitting: General Surgery

## 2017-05-22 ENCOUNTER — Other Ambulatory Visit: Payer: Self-pay | Admitting: Internal Medicine

## 2017-05-22 DIAGNOSIS — D0512 Intraductal carcinoma in situ of left breast: Secondary | ICD-10-CM | POA: Diagnosis not present

## 2017-05-22 DIAGNOSIS — Z853 Personal history of malignant neoplasm of breast: Secondary | ICD-10-CM

## 2017-05-23 DIAGNOSIS — H401122 Primary open-angle glaucoma, left eye, moderate stage: Secondary | ICD-10-CM | POA: Diagnosis not present

## 2017-05-23 DIAGNOSIS — H353131 Nonexudative age-related macular degeneration, bilateral, early dry stage: Secondary | ICD-10-CM | POA: Diagnosis not present

## 2017-05-23 DIAGNOSIS — H401112 Primary open-angle glaucoma, right eye, moderate stage: Secondary | ICD-10-CM | POA: Diagnosis not present

## 2017-05-23 DIAGNOSIS — H35342 Macular cyst, hole, or pseudohole, left eye: Secondary | ICD-10-CM | POA: Diagnosis not present

## 2017-08-04 ENCOUNTER — Other Ambulatory Visit: Payer: Self-pay | Admitting: Internal Medicine

## 2017-08-07 ENCOUNTER — Ambulatory Visit
Admission: RE | Admit: 2017-08-07 | Discharge: 2017-08-07 | Disposition: A | Discharge status: Home / Self Care | Payer: Medicare Other | Source: Ambulatory Visit | Attending: General Surgery | Admitting: General Surgery

## 2017-08-07 DIAGNOSIS — Z853 Personal history of malignant neoplasm of breast: Secondary | ICD-10-CM

## 2017-08-07 DIAGNOSIS — R928 Other abnormal and inconclusive findings on diagnostic imaging of breast: Secondary | ICD-10-CM | POA: Diagnosis not present

## 2017-08-07 DIAGNOSIS — Z86000 Personal history of in-situ neoplasm of breast: Secondary | ICD-10-CM | POA: Diagnosis not present

## 2017-08-14 DIAGNOSIS — M25571 Pain in right ankle and joints of right foot: Secondary | ICD-10-CM | POA: Diagnosis not present

## 2017-09-05 DIAGNOSIS — M25571 Pain in right ankle and joints of right foot: Secondary | ICD-10-CM | POA: Diagnosis not present

## 2017-09-11 DIAGNOSIS — M25571 Pain in right ankle and joints of right foot: Secondary | ICD-10-CM | POA: Diagnosis not present

## 2017-09-12 ENCOUNTER — Ambulatory Visit (INDEPENDENT_AMBULATORY_CARE_PROVIDER_SITE_OTHER): Payer: Medicare Other | Admitting: Internal Medicine

## 2017-09-12 VITALS — BP 150/80 | HR 74 | Temp 98.1°F | Ht 63.0 in | Wt 116.0 lb

## 2017-09-12 DIAGNOSIS — M79661 Pain in right lower leg: Secondary | ICD-10-CM

## 2017-09-12 DIAGNOSIS — I10 Essential (primary) hypertension: Secondary | ICD-10-CM

## 2017-09-12 DIAGNOSIS — S8011XS Contusion of right lower leg, sequela: Secondary | ICD-10-CM

## 2017-09-12 DIAGNOSIS — R030 Elevated blood-pressure reading, without diagnosis of hypertension: Secondary | ICD-10-CM | POA: Diagnosis not present

## 2017-09-12 DIAGNOSIS — M7989 Other specified soft tissue disorders: Secondary | ICD-10-CM

## 2017-09-12 MED ORDER — MUPIROCIN 2 % EX OINT: 1.0000 "application " | TOPICAL_OINTMENT | Freq: Two times a day (BID) | CUTANEOUS | 0 refills | Status: DC

## 2017-09-12 NOTE — Progress Notes (Signed)
   Subjective:    Patient ID: Christine Frost, female    DOB: 07/13/1930, 84 y.o.   MRN: [id number]  HPI Patient injured her right lower extremity some 2 months ago.  Subsequently developed some swelling below her knee in the calf area.  She has seen Dr. Latanya Maudlin and his PA who placed her on doxycycline for presumed cellulitis as there was some erythema on her right anterior lower extremity but this has been slow to improve.  She has a history of hypertension.  She is anxious today and speaking about how long this is taken to heal.  Her blood pressure is elevated.    Review of Systems see above     Objective:   Physical Exam Increased warmth right lower extremity below the knee with some erythema.  She also has firmness in her calf area.  She has generalized muscular atrophy of the lower extremities.  Homans sign is negative.       Assessment & Plan:  Swelling of calf area right lower extremity  Cellulitis right lower extremity  Essential hypertension aggravated by situational stress  Plan: Agree with doxycycline 100 mg twice daily for 10 days.  Have Doppler of the right lower extremity  Addendum: Doppler of the right lower extremity is negative for DVT.  She will follow-up August 13.

## 2017-09-13 ENCOUNTER — Ambulatory Visit (HOSPITAL_COMMUNITY)
Admission: RE | Admit: 2017-09-13 | Discharge: 2017-09-13 | Disposition: A | Discharge status: Home / Self Care | Payer: Medicare Other | Source: Ambulatory Visit | Attending: Internal Medicine | Admitting: Internal Medicine

## 2017-09-13 DIAGNOSIS — M7989 Other specified soft tissue disorders: Secondary | ICD-10-CM | POA: Insufficient documentation

## 2017-09-13 DIAGNOSIS — M79661 Pain in right lower leg: Secondary | ICD-10-CM | POA: Diagnosis not present

## 2017-09-13 NOTE — Progress Notes (Signed)
RLE venous duplex prelim: negative for DVT. Landry Mellow, RDMS, RVT  Attempted call report. Office is closed. Will let patient leave.

## 2017-09-13 NOTE — Progress Notes (Signed)
Please call pt. Doppler is negative for DVT

## 2017-09-14 DIAGNOSIS — H401122 Primary open-angle glaucoma, left eye, moderate stage: Secondary | ICD-10-CM | POA: Diagnosis not present

## 2017-09-14 DIAGNOSIS — H35373 Puckering of macula, bilateral: Secondary | ICD-10-CM | POA: Diagnosis not present

## 2017-09-14 DIAGNOSIS — H04123 Dry eye syndrome of bilateral lacrimal glands: Secondary | ICD-10-CM | POA: Diagnosis not present

## 2017-09-14 DIAGNOSIS — H40021 Open angle with borderline findings, high risk, right eye: Secondary | ICD-10-CM | POA: Diagnosis not present

## 2017-09-19 ENCOUNTER — Encounter: Payer: Self-pay | Admitting: Internal Medicine

## 2017-09-19 ENCOUNTER — Ambulatory Visit (INDEPENDENT_AMBULATORY_CARE_PROVIDER_SITE_OTHER): Payer: Medicare Other | Admitting: Internal Medicine

## 2017-09-19 VITALS — BP 130/72 | HR 67 | Temp 98.0°F | Ht 63.0 in | Wt 115.0 lb

## 2017-09-19 DIAGNOSIS — L03115 Cellulitis of right lower limb: Secondary | ICD-10-CM | POA: Diagnosis not present

## 2017-09-19 DIAGNOSIS — S8011XS Contusion of right lower leg, sequela: Secondary | ICD-10-CM | POA: Diagnosis not present

## 2017-09-24 ENCOUNTER — Telehealth: Payer: Self-pay | Admitting: Internal Medicine

## 2017-09-24 MED ORDER — PREDNISONE 10 MG PO TABS: ORAL_TABLET | ORAL | 1 refills | Status: DC

## 2017-09-24 NOTE — Telephone Encounter (Signed)
Pt called complaining of contact dermatitis left face since Thursday. Left eye nearly shut. Worked in yard Wednesday. Says topical cortisone not working. Call in prednisone dose pack and seen in am at 9:45 am

## 2017-09-25 ENCOUNTER — Ambulatory Visit (INDEPENDENT_AMBULATORY_CARE_PROVIDER_SITE_OTHER): Payer: Medicare Other | Admitting: Internal Medicine

## 2017-09-25 ENCOUNTER — Encounter: Payer: Self-pay | Admitting: Internal Medicine

## 2017-09-25 VITALS — BP 110/60 | HR 80 | Temp 98.2°F | Ht 63.0 in | Wt 113.0 lb

## 2017-09-25 DIAGNOSIS — L255 Unspecified contact dermatitis due to plants, except food: Secondary | ICD-10-CM

## 2017-10-01 ENCOUNTER — Encounter: Payer: Self-pay | Admitting: Internal Medicine

## 2017-10-01 NOTE — Progress Notes (Signed)
   Subjective:    Patient ID: Christine Frost, female    DOB: [birth year], 84 y.o.   MRN: [id number]  HPI Patient was seen here recently for cellulitis and hematoma right lower extremity.  She called yesterday complaining of a contact dermatitis affecting her left face with eye nearly swollen shut when she awakened yesterday morning.  She was staying with her daughter in [place].  Subsequently open some and she was able to drive home without any issues.  She called me today asking about an appointment.  I called in a prednisone Dosepak for her and she is here today for follow-up.  The patient apparently was working in her yard last week midweek and apparently came in contact plants that apparently caused contact dermatitis.  Face is been red itchy and swollen and left eye has been very swollen but it is improving.  She is only had one daily dose of the Medrol Dosepak.  Says she does not see a lot of difference just yet.    Review of Systems no difficulty breathing.  No wheezing.     Objective:   Physical Exam Erythema left face.  Left upper eyelid is swollen.  Chest clear to auscultation.       Assessment & Plan:  Contact dermatitis left face involving left eye.  Plan: Finish prednisone Dosepak as prescribed and may need a refill to completely resolved.  Splane to her it could take a couple of weeks to completely resolved.  Cannot say for sure that this was poison ivy but it sure is suspicious for that.  Patient will take prednisone 10 mg tablets (#21) going from 60 mg to 0 0 mg over 7 days

## 2017-10-01 NOTE — Patient Instructions (Signed)
Take prednisone Dosepak as directed and repeat in 1 week if necessary.

## 2017-10-01 NOTE — Patient Instructions (Addendum)
To have Doppler of the right lower extremity.  Doxycycline 100 mg twice daily for 10 days.  Keep leg elevated and follow-up August 13.  Continue antihypertensive medication.

## 2017-10-02 NOTE — Patient Instructions (Signed)
Finish course of doxycycline.  Return as needed.

## 2017-10-02 NOTE — Progress Notes (Signed)
   Subjective:    Patient ID: Christine Frost, female    DOB: [birth year], 84 y.o.   MRN: [id number]  HPI Here to follow-up on right lower extremity cellulitis and apparent hematoma right calf area.  Doppler studies were negative for DVT.  There is been considerable improvement with doxycycline.  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    Review of Systems see above     Objective:   Physical Exam Less swelling and firmness of?  Hematoma right lateral calf area.  Persistent discoloration of the right lower extremity without edema.  However there seems to be less warmth and tenderness.       Assessment & Plan:  Resolving cellulitis right lower extremity  Plan: Finish 10-day course of doxycycline prescribed on August 13

## 2017-10-02 NOTE — Progress Notes (Deleted)
   Subjective:    Patient ID: Christine Frost, female    DOB: 09-Mar-1930, 84 y.o.   MRN: [id number]  HPI    Review of Systems     Objective:   Physical Exam        Assessment & Plan:

## 2017-11-06 ENCOUNTER — Other Ambulatory Visit: Payer: Self-pay | Admitting: Internal Medicine

## 2017-12-25 ENCOUNTER — Other Ambulatory Visit: Payer: Self-pay | Admitting: Internal Medicine

## 2017-12-25 DIAGNOSIS — I10 Essential (primary) hypertension: Secondary | ICD-10-CM

## 2017-12-25 DIAGNOSIS — G609 Hereditary and idiopathic neuropathy, unspecified: Secondary | ICD-10-CM

## 2017-12-25 DIAGNOSIS — Z0000 Encounter for general adult medical examination without abnormal findings: Secondary | ICD-10-CM

## 2017-12-25 DIAGNOSIS — D0512 Intraductal carcinoma in situ of left breast: Secondary | ICD-10-CM

## 2017-12-25 DIAGNOSIS — E039 Hypothyroidism, unspecified: Secondary | ICD-10-CM

## 2017-12-26 ENCOUNTER — Other Ambulatory Visit: Payer: Medicare Other | Admitting: Internal Medicine

## 2017-12-26 DIAGNOSIS — Z0000 Encounter for general adult medical examination without abnormal findings: Secondary | ICD-10-CM

## 2017-12-26 DIAGNOSIS — G609 Hereditary and idiopathic neuropathy, unspecified: Secondary | ICD-10-CM

## 2017-12-26 DIAGNOSIS — I10 Essential (primary) hypertension: Secondary | ICD-10-CM

## 2017-12-26 DIAGNOSIS — E039 Hypothyroidism, unspecified: Secondary | ICD-10-CM | POA: Diagnosis not present

## 2017-12-26 DIAGNOSIS — D0512 Intraductal carcinoma in situ of left breast: Secondary | ICD-10-CM

## 2017-12-26 LAB — COMPLETE METABOLIC PANEL WITH GFR
AG Ratio: 2 (calc) (ref 1.0–2.5)
ALT: 15 U/L (ref 6–29)
AST: 19 U/L (ref 10–35)
Albumin: 4.1 g/dL (ref 3.6–5.1)
Alkaline phosphatase (APISO): 55 U/L (ref 33–130)
BUN: 11 mg/dL (ref 7–25)
CO2: 28 mmol/L (ref 20–32)
CREATININE: 0.7 mg/dL (ref 0.60–0.88)
Calcium: 9.6 mg/dL (ref 8.6–10.4)
Chloride: 106 mmol/L (ref 98–110)
GFR, Est African American: 92 mL/min/{1.73_m2} (ref >=60)
GFR, Est Non African American: 79 mL/min/{1.73_m2} (ref >=60)
GLUCOSE: 80 mg/dL (ref 65–99)
Globulin: 2.1 g/dL (calc) (ref 1.9–3.7)
Potassium: 4.7 mmol/L (ref 3.5–5.3)
Sodium: 142 mmol/L (ref 135–146)
TOTAL PROTEIN: 6.2 g/dL (ref 6.1–8.1)
Total Bilirubin: 0.6 mg/dL (ref 0.2–1.2)

## 2017-12-26 LAB — LIPID PANEL
CHOL/HDL RATIO: 2.6 (calc) (ref <5.0)
Cholesterol: 186 mg/dL (ref <200)
HDL: 72 mg/dL (ref >50)
LDL Cholesterol (Calc): 97 mg/dL (calc)
NON-HDL CHOLESTEROL (CALC): 114 mg/dL (ref <130)
TRIGLYCERIDES: 84 mg/dL (ref <150)

## 2017-12-26 LAB — CBC WITH DIFFERENTIAL/PLATELET
BASOS PCT: 1.2 %
Basophils Absolute: 62 cells/uL (ref 0–200)
Eosinophils Absolute: 161 cells/uL (ref 15–500)
Eosinophils Relative: 3.1 %
HEMATOCRIT: 40.6 % (ref 35.0–45.0)
HEMOGLOBIN: 14.2 g/dL (ref 11.7–15.5)
LYMPHS ABS: 1201 {cells}/uL (ref 850–3900)
MCH: 31.7 pg (ref 27.0–33.0)
MCHC: 35 g/dL (ref 32.0–36.0)
MCV: 90.6 fL (ref 80.0–100.0)
MPV: 9.3 fL (ref 7.5–12.5)
Monocytes Relative: 9.9 %
Neutro Abs: 3260 cells/uL (ref 1500–7800)
Neutrophils Relative %: 62.7 %
Platelets: 241 10*3/uL (ref 140–400)
RBC: 4.48 10*6/uL (ref 3.80–5.10)
RDW: 12.9 % (ref 11.0–15.0)
Total Lymphocyte: 23.1 %
WBC mixed population: 515 cells/uL (ref 200–950)
WBC: 5.2 10*3/uL (ref 3.8–10.8)

## 2017-12-26 LAB — TSH: TSH: 2.17 mIU/L (ref 0.40–4.50)

## 2017-12-28 ENCOUNTER — Encounter: Payer: Self-pay | Admitting: Internal Medicine

## 2017-12-28 ENCOUNTER — Other Ambulatory Visit: Payer: Self-pay

## 2017-12-28 ENCOUNTER — Ambulatory Visit (INDEPENDENT_AMBULATORY_CARE_PROVIDER_SITE_OTHER): Payer: Medicare Other | Admitting: Internal Medicine

## 2017-12-28 VITALS — BP 140/80 | HR 66 | Temp 98.3°F | Ht 63.0 in | Wt 115.3 lb

## 2017-12-28 DIAGNOSIS — E039 Hypothyroidism, unspecified: Secondary | ICD-10-CM | POA: Diagnosis not present

## 2017-12-28 DIAGNOSIS — I10 Essential (primary) hypertension: Secondary | ICD-10-CM

## 2017-12-28 DIAGNOSIS — G609 Hereditary and idiopathic neuropathy, unspecified: Secondary | ICD-10-CM | POA: Diagnosis not present

## 2017-12-28 DIAGNOSIS — Z0000 Encounter for general adult medical examination without abnormal findings: Secondary | ICD-10-CM | POA: Diagnosis not present

## 2017-12-28 DIAGNOSIS — D0512 Intraductal carcinoma in situ of left breast: Secondary | ICD-10-CM | POA: Diagnosis not present

## 2017-12-28 LAB — POCT URINALYSIS DIPSTICK
Appearance: NEGATIVE
Bilirubin, UA: NEGATIVE
Glucose, UA: NEGATIVE
Ketones, UA: NEGATIVE
LEUKOCYTES UA: NEGATIVE
NITRITE UA: NEGATIVE
Odor: NEGATIVE
PH UA: 6.5 (ref 5.0–8.0)
PROTEIN UA: NEGATIVE
SPEC GRAV UA: 1.01 (ref 1.010–1.025)
UROBILINOGEN UA: 0.2 U/dL
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
## 2018-01-06 ENCOUNTER — Encounter: Payer: Self-pay | Admitting: Internal Medicine

## 2018-01-06 NOTE — Patient Instructions (Addendum)
It was a pleasure to see you today.  Continue same medications and follow-up November 2020.

## 2018-03-22 DIAGNOSIS — H524 Presbyopia: Secondary | ICD-10-CM | POA: Diagnosis not present

## 2018-03-22 DIAGNOSIS — H401122 Primary open-angle glaucoma, left eye, moderate stage: Secondary | ICD-10-CM | POA: Diagnosis not present

## 2018-03-22 DIAGNOSIS — H35373 Puckering of macula, bilateral: Secondary | ICD-10-CM | POA: Diagnosis not present

## 2018-03-22 DIAGNOSIS — H40021 Open angle with borderline findings, high risk, right eye: Secondary | ICD-10-CM | POA: Diagnosis not present

## 2018-03-22 DIAGNOSIS — H04123 Dry eye syndrome of bilateral lacrimal glands: Secondary | ICD-10-CM | POA: Diagnosis not present

## 2018-05-14 ENCOUNTER — Other Ambulatory Visit: Payer: Self-pay | Admitting: Internal Medicine

## 2018-06-28 ENCOUNTER — Other Ambulatory Visit: Payer: Self-pay | Admitting: Internal Medicine

## 2018-06-28 DIAGNOSIS — Z853 Personal history of malignant neoplasm of breast: Secondary | ICD-10-CM

## 2018-07-04 DIAGNOSIS — H35342 Macular cyst, hole, or pseudohole, left eye: Secondary | ICD-10-CM | POA: Diagnosis not present

## 2018-07-04 DIAGNOSIS — H401122 Primary open-angle glaucoma, left eye, moderate stage: Secondary | ICD-10-CM | POA: Diagnosis not present

## 2018-07-04 DIAGNOSIS — H401112 Primary open-angle glaucoma, right eye, moderate stage: Secondary | ICD-10-CM | POA: Diagnosis not present

## 2018-07-04 DIAGNOSIS — H353131 Nonexudative age-related macular degeneration, bilateral, early dry stage: Secondary | ICD-10-CM | POA: Diagnosis not present

## 2018-07-09 ENCOUNTER — Ambulatory Visit (INDEPENDENT_AMBULATORY_CARE_PROVIDER_SITE_OTHER): Payer: Medicare Other | Admitting: Internal Medicine

## 2018-07-09 VITALS — BP 130/60 | HR 67 | Wt 118.0 lb

## 2018-07-09 DIAGNOSIS — H6122 Impacted cerumen, left ear: Secondary | ICD-10-CM

## 2018-07-09 MED ORDER — NEOMYCIN-POLYMYXIN-HC 3.5-10000-1 OT SOLN: 4.0000 [drp] | Freq: Two times a day (BID) | OTIC | 0 refills | Status: DC

## 2018-07-22 ENCOUNTER — Encounter: Payer: Self-pay | Admitting: Internal Medicine

## 2018-07-22 NOTE — Patient Instructions (Signed)
Impacted cerumen removed from left ear.  Return as needed.

## 2018-07-22 NOTE — Progress Notes (Signed)
   Subjective:    Patient ID: Christine Frost, female    DOB: 1930/09/18, 85 y.o.   MRN: [id number]  HPI 85 year old female in today with complaint of decreased hearing in left ear.  This is concerning to her.  She has a prior history of impacted cerumen.    Review of Systems see above     Objective:   Physical Exam Patient has impacted cerumen left external ear canal.  There is no evidence of otitis externa.  Using a curette a moderate amount of cerumen that was firm was removed from her external ear canal.  Patient tolerated the procedure well and there were no complications.  After that, she said she was able to hear out of the left ear just fine and was very pleased that she was much better with regard to her hearing       Assessment & Plan:  Impacted cerumen left ear  Plan: Cerumen successfully removed in office using curette.  Moderate amount was removed.  There were no complications and she tolerated the procedure well.

## 2018-08-15 ENCOUNTER — Other Ambulatory Visit: Payer: Self-pay

## 2018-08-15 ENCOUNTER — Ambulatory Visit
Admission: RE | Admit: 2018-08-15 | Discharge: 2018-08-15 | Disposition: A | Discharge status: Home / Self Care | Payer: Medicare Other | Source: Ambulatory Visit | Attending: Internal Medicine | Admitting: Internal Medicine

## 2018-08-15 DIAGNOSIS — R928 Other abnormal and inconclusive findings on diagnostic imaging of breast: Secondary | ICD-10-CM | POA: Diagnosis not present

## 2018-08-15 DIAGNOSIS — Z853 Personal history of malignant neoplasm of breast: Secondary | ICD-10-CM

## 2018-08-15 DIAGNOSIS — Z86000 Personal history of in-situ neoplasm of breast: Secondary | ICD-10-CM | POA: Diagnosis not present

## 2018-09-06 DIAGNOSIS — H40021 Open angle with borderline findings, high risk, right eye: Secondary | ICD-10-CM | POA: Diagnosis not present

## 2018-09-06 DIAGNOSIS — H524 Presbyopia: Secondary | ICD-10-CM | POA: Diagnosis not present

## 2018-09-06 DIAGNOSIS — H401122 Primary open-angle glaucoma, left eye, moderate stage: Secondary | ICD-10-CM | POA: Diagnosis not present

## 2018-09-06 DIAGNOSIS — H35373 Puckering of macula, bilateral: Secondary | ICD-10-CM | POA: Diagnosis not present

## 2018-09-06 DIAGNOSIS — H04123 Dry eye syndrome of bilateral lacrimal glands: Secondary | ICD-10-CM | POA: Diagnosis not present

## 2018-11-26 ENCOUNTER — Telehealth: Payer: Self-pay | Admitting: Internal Medicine

## 2018-11-26 MED ORDER — SYNTHROID 50 MCG PO TABS: ORAL_TABLET | ORAL | 0 refills | Status: DC

## 2018-11-26 NOTE — Telephone Encounter (Signed)
Received Fax RX request from  [facility] 50 MCG tablet   Last Refill - 08/20/18  Last OV - 07/09/18  Last CPE - 12/28/17  Next CPE scheduled for 12/31/18

## 2018-12-27 ENCOUNTER — Other Ambulatory Visit: Payer: Self-pay

## 2018-12-27 ENCOUNTER — Other Ambulatory Visit: Payer: Medicare Other | Admitting: Internal Medicine

## 2018-12-27 DIAGNOSIS — E039 Hypothyroidism, unspecified: Secondary | ICD-10-CM

## 2018-12-27 DIAGNOSIS — Z13220 Encounter for screening for lipoid disorders: Secondary | ICD-10-CM

## 2018-12-27 DIAGNOSIS — D0512 Intraductal carcinoma in situ of left breast: Secondary | ICD-10-CM | POA: Diagnosis not present

## 2018-12-27 DIAGNOSIS — G609 Hereditary and idiopathic neuropathy, unspecified: Secondary | ICD-10-CM

## 2018-12-27 DIAGNOSIS — Z0000 Encounter for general adult medical examination without abnormal findings: Secondary | ICD-10-CM

## 2018-12-27 DIAGNOSIS — I10 Essential (primary) hypertension: Secondary | ICD-10-CM

## 2018-12-28 LAB — CBC WITH DIFFERENTIAL/PLATELET
Absolute Monocytes: 525 cells/uL (ref 200–950)
Basophils Absolute: 61 cells/uL (ref 0–200)
Basophils Relative: 1.2 %
Eosinophils Absolute: 112 cells/uL (ref 15–500)
Eosinophils Relative: 2.2 %
HCT: 42.9 % (ref 35.0–45.0)
Hemoglobin: 14.4 g/dL (ref 11.7–15.5)
Lymphs Abs: 1321 cells/uL (ref 850–3900)
MCH: 31.2 pg (ref 27.0–33.0)
MCHC: 33.6 g/dL (ref 32.0–36.0)
MCV: 92.9 fL (ref 80.0–100.0)
MPV: 9.3 fL (ref 7.5–12.5)
Monocytes Relative: 10.3 %
Neutro Abs: 3080 cells/uL (ref 1500–7800)
Neutrophils Relative %: 60.4 %
Platelets: 244 10*3/uL (ref 140–400)
RBC: 4.62 10*6/uL (ref 3.80–5.10)
RDW: 13.4 % (ref 11.0–15.0)
Total Lymphocyte: 25.9 %
WBC: 5.1 10*3/uL (ref 3.8–10.8)

## 2018-12-28 LAB — COMPLETE METABOLIC PANEL WITH GFR
AG Ratio: 1.9 (calc) (ref 1.0–2.5)
ALT: 19 U/L (ref 6–29)
AST: 20 U/L (ref 10–35)
Albumin: 4.4 g/dL (ref 3.6–5.1)
Alkaline phosphatase (APISO): 52 U/L (ref 37–153)
BUN: 12 mg/dL (ref 7–25)
CO2: 29 mmol/L (ref 20–32)
Calcium: 10.1 mg/dL (ref 8.6–10.4)
Chloride: 105 mmol/L (ref 98–110)
Creat: 0.71 mg/dL (ref 0.60–0.88)
GFR, Est African American: 89 mL/min/{1.73_m2} (ref >=60)
GFR, Est Non African American: 77 mL/min/{1.73_m2} (ref >=60)
Globulin: 2.3 g/dL (calc) (ref 1.9–3.7)
Glucose, Bld: 81 mg/dL (ref 65–99)
Potassium: 5.3 mmol/L (ref 3.5–5.3)
Sodium: 143 mmol/L (ref 135–146)
Total Bilirubin: 0.7 mg/dL (ref 0.2–1.2)
Total Protein: 6.7 g/dL (ref 6.1–8.1)

## 2018-12-28 LAB — LIPID PANEL
Cholesterol: 212 mg/dL — ABNORMAL HIGH (ref <200)
HDL: 73 mg/dL (ref >=50)
LDL Cholesterol (Calc): 118 mg/dL (calc) — ABNORMAL HIGH
Non-HDL Cholesterol (Calc): 139 mg/dL (calc) — ABNORMAL HIGH (ref <130)
Total CHOL/HDL Ratio: 2.9 (calc) (ref <5.0)
Triglycerides: 99 mg/dL (ref <150)

## 2018-12-28 LAB — TSH: TSH: 2.24 mIU/L (ref 0.40–4.50)

## 2018-12-31 ENCOUNTER — Encounter: Payer: Self-pay | Admitting: Internal Medicine

## 2018-12-31 ENCOUNTER — Ambulatory Visit (INDEPENDENT_AMBULATORY_CARE_PROVIDER_SITE_OTHER): Payer: Medicare Other | Admitting: Internal Medicine

## 2018-12-31 ENCOUNTER — Other Ambulatory Visit: Payer: Self-pay

## 2018-12-31 VITALS — BP 120/80 | HR 81 | Ht 63.0 in | Wt 111.3 lb

## 2018-12-31 DIAGNOSIS — D0512 Intraductal carcinoma in situ of left breast: Secondary | ICD-10-CM

## 2018-12-31 DIAGNOSIS — Z0000 Encounter for general adult medical examination without abnormal findings: Secondary | ICD-10-CM | POA: Diagnosis not present

## 2018-12-31 DIAGNOSIS — G609 Hereditary and idiopathic neuropathy, unspecified: Secondary | ICD-10-CM | POA: Diagnosis not present

## 2018-12-31 DIAGNOSIS — I10 Essential (primary) hypertension: Secondary | ICD-10-CM

## 2018-12-31 DIAGNOSIS — E039 Hypothyroidism, unspecified: Secondary | ICD-10-CM

## 2018-12-31 DIAGNOSIS — R8290 Unspecified abnormal findings in urine: Secondary | ICD-10-CM | POA: Diagnosis not present

## 2018-12-31 LAB — POCT URINALYSIS DIPSTICK
Appearance: NEGATIVE
Bilirubin, UA: NEGATIVE
Glucose, UA: NEGATIVE
Ketones, UA: NEGATIVE
Nitrite, UA: NEGATIVE
Odor: NEGATIVE
Protein, UA: NEGATIVE
Spec Grav, UA: 1.015 (ref 1.010–1.025)
Urobilinogen, UA: 0.2 E.U./dL
pH, UA: 6.5 (ref 5.0–8.0)
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
## 2019-01-02 LAB — URINE CULTURE
MICRO NUMBER:: 1130550
Result:: NO GROWTH
SPECIMEN QUALITY:: ADEQUATE

## 2019-01-07 NOTE — Patient Instructions (Signed)
It was a pleasure to see you today.  Stay safe and well during the pandemic.  Continue current medications and return in 1 year or as needed.

## 2019-03-04 ENCOUNTER — Other Ambulatory Visit: Payer: Self-pay

## 2019-03-04 MED ORDER — SYNTHROID 50 MCG PO TABS: ORAL_TABLET | ORAL | 3 refills | Status: DC

## 2019-03-09 ENCOUNTER — Ambulatory Visit: Payer: Medicare Other

## 2019-03-21 DIAGNOSIS — H04123 Dry eye syndrome of bilateral lacrimal glands: Secondary | ICD-10-CM | POA: Diagnosis not present

## 2019-03-21 DIAGNOSIS — H35373 Puckering of macula, bilateral: Secondary | ICD-10-CM | POA: Diagnosis not present

## 2019-03-21 DIAGNOSIS — H40021 Open angle with borderline findings, high risk, right eye: Secondary | ICD-10-CM | POA: Diagnosis not present

## 2019-03-21 DIAGNOSIS — H401122 Primary open-angle glaucoma, left eye, moderate stage: Secondary | ICD-10-CM | POA: Diagnosis not present

## 2019-06-06 ENCOUNTER — Other Ambulatory Visit: Payer: Self-pay | Admitting: Internal Medicine

## 2019-06-06 MED ORDER — LOSARTAN POTASSIUM 50 MG PO TABS: 50.0000 mg | ORAL_TABLET | Freq: Every day | ORAL | 2 refills | Status: DC

## 2019-06-06 MED ORDER — LOSARTAN POTASSIUM 50 MG PO TABS: 50.0000 mg | ORAL_TABLET | Freq: Every day | ORAL | 1 refills | Status: DC

## 2019-06-06 NOTE — Telephone Encounter (Signed)
Received Fax RX request from  [facility] [place], [place] [facility] [place] Phone:  [phone]  Fax:  [phone]       Medication -  losartan (COZAAR) 50 MG tablet  Last Refill - 03/04/2019  Last OV - 12/31/18  Last CPE - 12/31/18   Next Appointment - 01/06/2020

## 2019-06-06 NOTE — Telephone Encounter (Signed)
Refill through November 2021

## 2019-06-06 NOTE — Addendum Note (Signed)
Addended by: Mady Haagensen on: 06/06/2019 04:58 PM   Modules accepted: Orders

## 2019-06-11 ENCOUNTER — Other Ambulatory Visit: Payer: Self-pay | Admitting: Internal Medicine

## 2019-06-11 DIAGNOSIS — Z853 Personal history of malignant neoplasm of breast: Secondary | ICD-10-CM

## 2019-07-03 ENCOUNTER — Ambulatory Visit (INDEPENDENT_AMBULATORY_CARE_PROVIDER_SITE_OTHER): Payer: Medicare Other | Admitting: Ophthalmology

## 2019-07-03 ENCOUNTER — Other Ambulatory Visit: Payer: Self-pay

## 2019-07-03 ENCOUNTER — Encounter (INDEPENDENT_AMBULATORY_CARE_PROVIDER_SITE_OTHER): Payer: Self-pay | Admitting: Ophthalmology

## 2019-07-03 DIAGNOSIS — H43811 Vitreous degeneration, right eye: Secondary | ICD-10-CM

## 2019-07-03 DIAGNOSIS — H353131 Nonexudative age-related macular degeneration, bilateral, early dry stage: Secondary | ICD-10-CM | POA: Diagnosis not present

## 2019-07-03 DIAGNOSIS — H401131 Primary open-angle glaucoma, bilateral, mild stage: Secondary | ICD-10-CM

## 2019-07-03 DIAGNOSIS — H35342 Macular cyst, hole, or pseudohole, left eye: Secondary | ICD-10-CM | POA: Diagnosis not present

## 2019-07-03 DIAGNOSIS — H401123 Primary open-angle glaucoma, left eye, severe stage: Secondary | ICD-10-CM | POA: Insufficient documentation

## 2019-07-03 DIAGNOSIS — H401112 Primary open-angle glaucoma, right eye, moderate stage: Secondary | ICD-10-CM

## 2019-07-03 DIAGNOSIS — Z9889 Other specified postprocedural states: Secondary | ICD-10-CM | POA: Diagnosis not present

## 2019-07-03 NOTE — Progress Notes (Signed)
07/03/2019     CHIEF COMPLAINT Patient presents for Retina Follow Up   HISTORY OF PRESENT ILLNESS: Christine Frost is a 86 y.o. female who presents to the clinic today for:   HPI    Retina Follow Up    Patient presents with  Dry AMD.  In both eyes.  This started 1 year ago.  Severity is mild.  Duration of 1 year.  Since onset it is stable.          Comments    1 Year AMD F/U OU  Pt denies noticeable changes to [place] OU since last visit. Pt denies ocular pain, flashes of light, or floaters OU.         Last edited by Rockie Neighbours, [place] on 07/03/2019  8:19 AM. (History)      Referring physician: Elby Showers, MD [address] [place],  [place] [address]  HISTORICAL INFORMATION:   Selected notes from the MEDICAL RECORD NUMBER       CURRENT MEDICATIONS: Current Outpatient Medications (Ophthalmic Drugs)  Medication Sig  . latanoprost (XALATAN) 0.005 % ophthalmic solution Place 1 drop into both eyes at bedtime.   . timolol (TIMOPTIC) 0.5 % ophthalmic solution    No current facility-administered medications for this visit. (Ophthalmic Drugs)   Current Outpatient Medications (Other)  Medication Sig  . aspirin 81 MG tablet Take 81 mg by mouth daily.    . Cholecalciferol (VITAMIN D-3) 1000 UNITS CAPS Take 1 capsule by mouth daily.  . Cyanocobalamin 1000 MCG CAPS Take by mouth daily.  Marland Kitchen losartan (COZAAR) 50 MG tablet Take 1 tablet (50 mg total) by mouth daily.  . Multiple Vitamins-Minerals (CENTRUM SILVER PO) Take by mouth.    . Multiple Vitamins-Minerals (CENTRUM SILVER ULTRA WOMENS) TABS Take by mouth.  . Omega-3 Fatty Acids (FISH OIL) 1200 MG CAPS Take 1 capsule by mouth 2 (two) times daily.   Marland Kitchen SYNTHROID 50 MCG tablet TAKE 1 TABLET(50 MCG) BY MOUTH DAILY  . TURMERIC PO Take by mouth.   No current facility-administered medications for this visit. (Other)      REVIEW OF SYSTEMS:    ALLERGIES Allergies  Allergen Reactions  . Penicillins Rash  . Pred  Forte [Prednisolone Acetate] Other (See Comments)    OPHTHALMIC - EXCESSIVE TEARING OF EYES    PAST MEDICAL HISTORY Past Medical History:  Diagnosis Date  . Breast cancer ([place])    2016 left   . Difficult intubation   . Ductal carcinoma in situ (DCIS) of left breast 07/2014  . History of cellulitis    right leg - finished antibiotic 09/14/2014  . Hypertension    states does not have HTN, has been on Losartan since 2007  . Hypothyroidism   . Macular degeneration    left eye  . Peripheral neuropathy    lower legs   Past Surgical History:  Procedure Laterality Date  . ABDOMINAL HYSTERECTOMY  age 77   partial  . BREAST LUMPECTOMY Left    2016 Malignant  . BREAST LUMPECTOMY WITH RADIOACTIVE SEED LOCALIZATION Left 09/24/2014   Procedure: LEFT BREAST LUMPECTOMY WITH RADIOACTIVE SEED LOCALIZATION;  Surgeon: Rolm Bookbinder, MD;  Location: [facility];  Service: General;  Laterality: Left;  . CATARACT EXTRACTION W/ INTRAOCULAR LENS  IMPLANT, BILATERAL Bilateral   . CHOLECYSTECTOMY  11/02/2001  . EXCISION MORTON'S NEUROMA Bilateral 1974  . KNEE ARTHROSCOPY Left 01/06/2009  . PARS PLANA VITRECTOMY W/ REPAIR OF MACULAR HOLE Left 08/1996 & 12/1996  .  PILONIDAL CYST EXCISION  1953    FAMILY HISTORY Family History  Problem Relation Age of Onset  . Heart disease Mother   . Stroke Mother   . Hypertension Mother   . Alcohol abuse Father   . Heart disease Brother   . Melanoma Daughter     SOCIAL HISTORY Social History   Tobacco Use  . Smoking status: Never Smoker  . Smokeless tobacco: Never Used  Substance Use Topics  . Alcohol use: Yes    Alcohol/week: 0.0 standard drinks    Comment: 1 glass of wine per day  . Drug use: No         OPHTHALMIC EXAM:  Base Eye Exam    Visual Acuity (ETDRS)      Right Left   Dist [place] 20/20 -2 20/70ecc +1   Dist ph [place]  20/60 -2       Tonometry (Tonopen, 8:25 AM)      Right Left   Pressure 09 08       Pupils      Pupils  Dark Light Shape React APD   Right PERRL 4 3 Round Brisk None   Left PERRL 4 3 Round Brisk None       Visual Fields (Counting fingers)      Left Right    Full Full       Extraocular Movement      Right Left    Full Full       Neuro/Psych    Oriented x3: Yes   Mood/Affect: Normal       Dilation    Both eyes: 1.0% Mydriacyl, 2.5% Phenylephrine @ 8:25 AM        Slit Lamp and Fundus Exam    External Exam      Right Left   External Normal Normal       Slit Lamp Exam      Right Left   Lids/Lashes Normal Normal   Conjunctiva/Sclera White and quiet White and quiet   Cornea Clear Clear   Anterior Chamber Deep and quiet Deep and quiet   Iris Round and reactive Round and reactive   Anterior Vitreous Normal Normal       Fundus Exam      Right Left   Posterior Vitreous Posterior vitreous detachment VITRECTOMIZED   Disc Normal Normal   C/D Ratio 0.45 0.7   Macula Normal Normal   Vessels Normal Normal   Periphery Normal Normal          IMAGING AND PROCEDURES  Imaging and Procedures for 07/03/19  OCT, Retina - OU - Both Eyes       Right Eye Quality was good. Scan locations included subfoveal. Central Foveal Thickness: 230. Progression has been stable. Findings include normal observations.   Left Eye Quality was good. Scan locations included subfoveal. Central Foveal Thickness: 221. Progression has been stable. Findings include normal observations.   Notes PVD OD                 ASSESSMENT/PLAN:  Early stage nonexudative age-related macular degeneration of both eyes The nature of age--related macular degeneration was discussed with the patient as well as the distinction between dry and wet types. Checking an Amsler Grid daily with advice to return immediately should a distortion develop, was given to the patient. The patient 's smoking status now and in the past was determined and advice based on the AREDS study was provided regarding the consumption  of antioxidant supplements. AREDS  2 vitamin formulation was recommended. Consumption of dark leafy vegetables and fresh fruits of various colors was recommended. Treatment modalities for wet macular degeneration particularly the use of intravitreal injections of anti-blood vessel growth factors was discussed with the patient. Avastin, Lucentis, and Eylea are the available options. On occasion, therapy includes the use of photodynamic therapy and thermal laser. Stressed to the patient do not rub eyes. All patient questions were answered.      ICD-10-CM   1. Early stage nonexudative age-related macular degeneration of both eyes  H35.3131 OCT, Retina - OU - Both Eyes  2. Lamellar macular hole of left eye  H35.342   3. Primary open angle glaucoma of right eye, moderate stage  H40.1112   4. History of vitrectomy  Z98.890   5. Posterior vitreous detachment of right eye  H43.811   6. Primary open angle glaucoma of both eyes, mild stage  H40.1131     1. Will continue to observe  2. Continue glaucoma care with Dr. Bing Plume  3.  Ophthalmic Meds Ordered this visit:  No orders of the defined types were placed in this encounter.      Return in about 1 year (around 07/02/2020) for DILATE OU, OCT.  There are no Patient Instructions on file for this visit.   Explained the diagnoses, plan, and follow up with the patient and they expressed understanding.  Patient expressed understanding of the importance of proper follow up care.   Clent Demark Carol Theys M.D. Diseases & Surgery of the Retina and Vitreous [facility] 07/03/19     Abbreviations: M myopia (nearsighted); A astigmatism; H hyperopia (farsighted); P presbyopia; Mrx spectacle prescription;  CTL contact lenses; OD right eye; OS left eye; OU both eyes  XT exotropia; ET esotropia; PEK punctate epithelial keratitis; PEE punctate epithelial erosions; DES dry eye syndrome; MGD meibomian gland dysfunction; ATs artificial tears; PFAT's  preservative free artificial tears; [place] nuclear sclerotic cataract; PSC posterior subcapsular cataract; ERM epi-retinal membrane; PVD posterior vitreous detachment; RD retinal detachment; DM diabetes mellitus; DR diabetic retinopathy; NPDR non-proliferative diabetic retinopathy; PDR proliferative diabetic retinopathy; CSME clinically significant macular edema; DME diabetic macular edema; dbh dot blot hemorrhages; CWS cotton wool spot; POAG primary open angle glaucoma; C/D cup-to-disc ratio; HVF humphrey visual field; GVF goldmann visual field; OCT optical coherence tomography; IOP intraocular pressure; BRVO Branch retinal vein occlusion; CRVO central retinal vein occlusion; CRAO central retinal artery occlusion; BRAO branch retinal artery occlusion; RT retinal tear; SB scleral buckle; PPV pars plana vitrectomy; VH Vitreous hemorrhage; PRP panretinal laser photocoagulation; IVK intravitreal kenalog; VMT vitreomacular traction; MH Macular hole;  NVD neovascularization of the disc; NVE neovascularization elsewhere; AREDS age related eye disease study; ARMD age related macular degeneration; POAG primary open angle glaucoma; EBMD epithelial/anterior basement membrane dystrophy; ACIOL anterior chamber intraocular lens; IOL intraocular lens; PCIOL posterior chamber intraocular lens; Phaco/IOL phacoemulsification with intraocular lens placement; [place] photorefractive keratectomy; LASIK laser assisted in situ keratomileusis; HTN hypertension; DM diabetes mellitus; COPD chronic obstructive pulmonary disease

## 2019-07-03 NOTE — Assessment & Plan Note (Signed)

## 2019-08-21 ENCOUNTER — Other Ambulatory Visit: Payer: Self-pay

## 2019-08-21 ENCOUNTER — Ambulatory Visit
Admission: RE | Admit: 2019-08-21 | Discharge: 2019-08-21 | Disposition: A | Discharge status: Home / Self Care | Payer: Medicare Other | Source: Ambulatory Visit | Attending: Internal Medicine | Admitting: Internal Medicine

## 2019-08-21 DIAGNOSIS — Z853 Personal history of malignant neoplasm of breast: Secondary | ICD-10-CM

## 2019-09-18 ENCOUNTER — Telehealth: Payer: Self-pay | Admitting: Internal Medicine

## 2019-09-18 NOTE — Telephone Encounter (Signed)
Refill through November

## 2019-09-18 NOTE — Telephone Encounter (Signed)
Received Fax RX request from  [facility], [place] [facility] [place] Phone:  [phone]  Fax:  [phone]       Medication - losartan (COZAAR) 50 MG tablet   Last Refill - 09/13/19  Last OV - 12/31/18  Last CPE - 12/31/18  Next Appointment - 01/06/20

## 2019-09-19 MED ORDER — LOSARTAN POTASSIUM 50 MG PO TABS: 50.0000 mg | ORAL_TABLET | Freq: Every day | ORAL | 0 refills | Status: DC

## 2019-10-08 DIAGNOSIS — H401122 Primary open-angle glaucoma, left eye, moderate stage: Secondary | ICD-10-CM | POA: Diagnosis not present

## 2019-10-08 DIAGNOSIS — H04123 Dry eye syndrome of bilateral lacrimal glands: Secondary | ICD-10-CM | POA: Diagnosis not present

## 2019-10-08 DIAGNOSIS — H40021 Open angle with borderline findings, high risk, right eye: Secondary | ICD-10-CM | POA: Diagnosis not present

## 2019-10-08 DIAGNOSIS — H35373 Puckering of macula, bilateral: Secondary | ICD-10-CM | POA: Diagnosis not present

## 2019-12-02 DIAGNOSIS — Z23 Encounter for immunization: Secondary | ICD-10-CM | POA: Diagnosis not present

## 2019-12-27 ENCOUNTER — Other Ambulatory Visit: Payer: Self-pay

## 2019-12-27 ENCOUNTER — Other Ambulatory Visit: Payer: Medicare Other | Admitting: Internal Medicine

## 2019-12-27 DIAGNOSIS — Z0000 Encounter for general adult medical examination without abnormal findings: Secondary | ICD-10-CM

## 2019-12-27 DIAGNOSIS — E039 Hypothyroidism, unspecified: Secondary | ICD-10-CM | POA: Diagnosis not present

## 2019-12-27 DIAGNOSIS — I10 Essential (primary) hypertension: Secondary | ICD-10-CM

## 2019-12-27 DIAGNOSIS — D0512 Intraductal carcinoma in situ of left breast: Secondary | ICD-10-CM | POA: Diagnosis not present

## 2019-12-27 DIAGNOSIS — Z1329 Encounter for screening for other suspected endocrine disorder: Secondary | ICD-10-CM

## 2019-12-27 DIAGNOSIS — G609 Hereditary and idiopathic neuropathy, unspecified: Secondary | ICD-10-CM

## 2019-12-28 LAB — CBC WITH DIFFERENTIAL/PLATELET
Absolute Monocytes: 498 cells/uL (ref 200–950)
Basophils Absolute: 63 cells/uL (ref 0–200)
Basophils Relative: 1 %
Eosinophils Absolute: 183 cells/uL (ref 15–500)
Eosinophils Relative: 2.9 %
HCT: 42.8 % (ref 35.0–45.0)
Hemoglobin: 14.8 g/dL (ref 11.7–15.5)
Lymphs Abs: 1550 cells/uL (ref 850–3900)
MCH: 31.6 pg (ref 27.0–33.0)
MCHC: 34.6 g/dL (ref 32.0–36.0)
MCV: 91.5 fL (ref 80.0–100.0)
MPV: 9.2 fL (ref 7.5–12.5)
Monocytes Relative: 7.9 %
Neutro Abs: 4007 cells/uL (ref 1500–7800)
Neutrophils Relative %: 63.6 %
Platelets: 242 10*3/uL (ref 140–400)
RBC: 4.68 10*6/uL (ref 3.80–5.10)
RDW: 13 % (ref 11.0–15.0)
Total Lymphocyte: 24.6 %
WBC: 6.3 10*3/uL (ref 3.8–10.8)

## 2019-12-28 LAB — COMPLETE METABOLIC PANEL WITH GFR
AG Ratio: 1.8 (calc) (ref 1.0–2.5)
ALT: 19 U/L (ref 6–29)
AST: 21 U/L (ref 10–35)
Albumin: 4.6 g/dL (ref 3.6–5.1)
Alkaline phosphatase (APISO): 58 U/L (ref 37–153)
BUN: 10 mg/dL (ref 7–25)
CO2: 28 mmol/L (ref 20–32)
Calcium: 9.9 mg/dL (ref 8.6–10.4)
Chloride: 105 mmol/L (ref 98–110)
Creat: 0.61 mg/dL (ref 0.60–0.88)
GFR, Est African American: 94 mL/min/{1.73_m2} (ref >=60)
GFR, Est Non African American: 82 mL/min/{1.73_m2} (ref >=60)
Globulin: 2.5 g/dL (calc) (ref 1.9–3.7)
Glucose, Bld: 85 mg/dL (ref 65–99)
Potassium: 4.4 mmol/L (ref 3.5–5.3)
Sodium: 143 mmol/L (ref 135–146)
Total Bilirubin: 0.7 mg/dL (ref 0.2–1.2)
Total Protein: 7.1 g/dL (ref 6.1–8.1)

## 2019-12-28 LAB — LIPID PANEL
Cholesterol: 220 mg/dL — ABNORMAL HIGH (ref <200)
HDL: 76 mg/dL (ref >=50)
LDL Cholesterol (Calc): 123 mg/dL (calc) — ABNORMAL HIGH
Non-HDL Cholesterol (Calc): 144 mg/dL (calc) — ABNORMAL HIGH (ref <130)
Total CHOL/HDL Ratio: 2.9 (calc) (ref <5.0)
Triglycerides: 107 mg/dL (ref <150)

## 2019-12-28 LAB — TSH: TSH: 2.25 mIU/L (ref 0.40–4.50)

## 2019-12-31 ENCOUNTER — Encounter: Payer: PRIVATE HEALTH INSURANCE | Admitting: Internal Medicine

## 2020-01-06 ENCOUNTER — Other Ambulatory Visit: Payer: Self-pay

## 2020-01-06 ENCOUNTER — Ambulatory Visit (INDEPENDENT_AMBULATORY_CARE_PROVIDER_SITE_OTHER): Payer: Medicare Other | Admitting: Internal Medicine

## 2020-01-06 ENCOUNTER — Encounter: Payer: Self-pay | Admitting: Internal Medicine

## 2020-01-06 VITALS — BP 140/80 | HR 71 | Ht 63.0 in | Wt 108.0 lb

## 2020-01-06 DIAGNOSIS — G609 Hereditary and idiopathic neuropathy, unspecified: Secondary | ICD-10-CM

## 2020-01-06 DIAGNOSIS — D0512 Intraductal carcinoma in situ of left breast: Secondary | ICD-10-CM | POA: Diagnosis not present

## 2020-01-06 DIAGNOSIS — I10 Essential (primary) hypertension: Secondary | ICD-10-CM

## 2020-01-06 DIAGNOSIS — E7800 Pure hypercholesterolemia, unspecified: Secondary | ICD-10-CM | POA: Diagnosis not present

## 2020-01-06 DIAGNOSIS — Z0000 Encounter for general adult medical examination without abnormal findings: Secondary | ICD-10-CM | POA: Diagnosis not present

## 2020-01-06 DIAGNOSIS — E039 Hypothyroidism, unspecified: Secondary | ICD-10-CM | POA: Diagnosis not present

## 2020-01-06 LAB — POCT URINALYSIS DIPSTICK
Appearance: NEGATIVE
Bilirubin, UA: NEGATIVE
Blood, UA: NEGATIVE
Glucose, UA: NEGATIVE
Ketones, UA: NEGATIVE
Leukocytes, UA: NEGATIVE
Nitrite, UA: NEGATIVE
Odor: NEGATIVE
Protein, UA: NEGATIVE
Spec Grav, UA: 1.015 (ref 1.010–1.025)
Urobilinogen, UA: 0.2 E.U./dL
pH, UA: 6.5 (ref 5.0–8.0)
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
## 2020-01-06 NOTE — Patient Instructions (Signed)
It was a pleasure to see you today. Continue same meds and RTC in one year.

## 2020-03-13 DIAGNOSIS — H40021 Open angle with borderline findings, high risk, right eye: Secondary | ICD-10-CM | POA: Diagnosis not present

## 2020-03-13 DIAGNOSIS — H401122 Primary open-angle glaucoma, left eye, moderate stage: Secondary | ICD-10-CM | POA: Diagnosis not present

## 2020-03-19 ENCOUNTER — Other Ambulatory Visit: Payer: Self-pay | Admitting: Internal Medicine

## 2020-04-22 ENCOUNTER — Other Ambulatory Visit: Payer: Self-pay | Admitting: Internal Medicine

## 2020-07-02 ENCOUNTER — Encounter (INDEPENDENT_AMBULATORY_CARE_PROVIDER_SITE_OTHER): Payer: Medicare Other | Admitting: Ophthalmology

## 2020-07-10 ENCOUNTER — Other Ambulatory Visit: Payer: Self-pay | Admitting: Internal Medicine

## 2020-07-10 DIAGNOSIS — Z1231 Encounter for screening mammogram for malignant neoplasm of breast: Secondary | ICD-10-CM

## 2020-07-14 DIAGNOSIS — H35373 Puckering of macula, bilateral: Secondary | ICD-10-CM | POA: Diagnosis not present

## 2020-07-14 DIAGNOSIS — H40021 Open angle with borderline findings, high risk, right eye: Secondary | ICD-10-CM | POA: Diagnosis not present

## 2020-07-14 DIAGNOSIS — H401122 Primary open-angle glaucoma, left eye, moderate stage: Secondary | ICD-10-CM | POA: Diagnosis not present

## 2020-07-14 DIAGNOSIS — H524 Presbyopia: Secondary | ICD-10-CM | POA: Diagnosis not present

## 2020-07-14 DIAGNOSIS — H52223 Regular astigmatism, bilateral: Secondary | ICD-10-CM | POA: Diagnosis not present

## 2020-07-14 DIAGNOSIS — H04123 Dry eye syndrome of bilateral lacrimal glands: Secondary | ICD-10-CM | POA: Diagnosis not present

## 2020-07-15 ENCOUNTER — Ambulatory Visit (INDEPENDENT_AMBULATORY_CARE_PROVIDER_SITE_OTHER): Payer: Medicare Other | Admitting: Ophthalmology

## 2020-07-15 ENCOUNTER — Encounter (INDEPENDENT_AMBULATORY_CARE_PROVIDER_SITE_OTHER): Payer: Self-pay | Admitting: Ophthalmology

## 2020-07-15 ENCOUNTER — Other Ambulatory Visit: Payer: Self-pay

## 2020-07-15 DIAGNOSIS — H35342 Macular cyst, hole, or pseudohole, left eye: Secondary | ICD-10-CM

## 2020-07-15 DIAGNOSIS — H401123 Primary open-angle glaucoma, left eye, severe stage: Secondary | ICD-10-CM

## 2020-07-15 DIAGNOSIS — H43811 Vitreous degeneration, right eye: Secondary | ICD-10-CM

## 2020-07-15 DIAGNOSIS — H353131 Nonexudative age-related macular degeneration, bilateral, early dry stage: Secondary | ICD-10-CM | POA: Diagnosis not present

## 2020-07-15 DIAGNOSIS — H401112 Primary open-angle glaucoma, right eye, moderate stage: Secondary | ICD-10-CM | POA: Diagnosis not present

## 2020-07-15 NOTE — Assessment & Plan Note (Signed)

## 2020-07-15 NOTE — Assessment & Plan Note (Signed)
Resolved OS, no reopening of hole looks great stable

## 2020-07-15 NOTE — Progress Notes (Signed)
07/15/2020     CHIEF COMPLAINT Patient presents for Retina Follow Up (1 year fu OU and OCT/Pt states VA OU stable since last visit. Pt denies FOL, floaters, or ocular pain OU. /Pt reports using Timolol Qday OU and Latanoprost QHS OU/)   HISTORY OF PRESENT ILLNESS: Christine Frost is a 87 y.o. female who presents to the clinic today for:   HPI    Retina Follow Up    Diagnosis: Dry AMD   Laterality: both eyes   Onset: 1 year ago   Severity: mild   Duration: 1 year   Course: stable   Comments: 1 year fu OU and OCT Pt states VA OU stable since last visit. Pt denies FOL, floaters, or ocular pain OU.  Pt reports using Timolol Qday OU and Latanoprost QHS OU        Last edited by Kendra Opitz, COA on 07/15/2020  9:28 AM. (History)      Referring physician: Elby Showers, MD [address],  [place] [id number]  HISTORICAL INFORMATION:   Selected notes from the MEDICAL RECORD NUMBER       CURRENT MEDICATIONS: Current Outpatient Medications (Ophthalmic Drugs)  Medication Sig  . latanoprost (XALATAN) 0.005 % ophthalmic solution Place 1 drop into both eyes at bedtime.   . timolol (TIMOPTIC) 0.5 % ophthalmic solution    No current facility-administered medications for this visit. (Ophthalmic Drugs)   Current Outpatient Medications (Other)  Medication Sig  . aspirin 81 MG tablet Take 81 mg by mouth daily.    . Cholecalciferol (VITAMIN D-3) 1000 UNITS CAPS Take 1 capsule by mouth daily.  . Cyanocobalamin 1000 MCG CAPS Take by mouth daily.  Marland Kitchen losartan (COZAAR) 50 MG tablet TAKE 1 TABLET(50 MG) BY MOUTH DAILY  . Multiple Vitamins-Minerals (CENTRUM SILVER PO) Take by mouth.    . Multiple Vitamins-Minerals (CENTRUM SILVER ULTRA WOMENS) TABS Take by mouth.  . Omega-3 Fatty Acids (FISH OIL) 1200 MG CAPS Take 1 capsule by mouth 2 (two) times daily.   Marland Kitchen SYNTHROID 50 MCG tablet TAKE 1 TABLET(50 MCG) BY MOUTH DAILY  . TURMERIC PO Take by mouth.   No current  facility-administered medications for this visit. (Other)      REVIEW OF SYSTEMS:    ALLERGIES Allergies  Allergen Reactions  . Penicillins Rash  . Pred Forte [Prednisolone Acetate] Other (See Comments)    OPHTHALMIC - EXCESSIVE TEARING OF EYES    PAST MEDICAL HISTORY Past Medical History:  Diagnosis Date  . Breast cancer ([place])    2016 left   . Difficult intubation   . Ductal carcinoma in situ (DCIS) of left breast 07/2014  . History of cellulitis    right leg - finished antibiotic 09/14/2014  . Hypertension    states does not have HTN, has been on Losartan since 2007  . Hypothyroidism   . Macular degeneration    left eye  . Peripheral neuropathy    lower legs   Past Surgical History:  Procedure Laterality Date  . ABDOMINAL HYSTERECTOMY  age 32   partial  . BREAST LUMPECTOMY Left    2016 Malignant  . BREAST LUMPECTOMY WITH RADIOACTIVE SEED LOCALIZATION Left 09/24/2014   Procedure: LEFT BREAST LUMPECTOMY WITH RADIOACTIVE SEED LOCALIZATION;  Surgeon: Rolm Bookbinder, MD;  Location: [facility];  Service: General;  Laterality: Left;  . CATARACT EXTRACTION W/ INTRAOCULAR LENS  IMPLANT, BILATERAL Bilateral   . CHOLECYSTECTOMY  11/02/2001  . EXCISION MORTON'S NEUROMA Bilateral  1974  . KNEE ARTHROSCOPY Left 01/06/2009  . PARS PLANA VITRECTOMY W/ REPAIR OF MACULAR HOLE Left 08/1996 & 12/1996  . PILONIDAL CYST EXCISION  1953    FAMILY HISTORY Family History  Problem Relation Age of Onset  . Heart disease Mother   . Stroke Mother   . Hypertension Mother   . Alcohol abuse Father   . Heart disease Brother   . Melanoma Daughter     SOCIAL HISTORY Social History   Tobacco Use  . Smoking status: Never Smoker  . Smokeless tobacco: Never Used  Substance Use Topics  . Alcohol use: Yes    Alcohol/week: 0.0 standard drinks    Comment: 1 glass of wine per day  . Drug use: No         OPHTHALMIC EXAM:  Base Eye Exam    Visual Acuity (ETDRS)       Right Left   Dist [place] 20/25 +2 20/60 -2   Dist ph [place]  NI       Tonometry (Tonopen, 9:32 AM)      Right Left   Pressure 14 14       Pupils      Pupils Dark Light Shape React APD   Right PERRL 4 3 Round Brisk None   Left PERRL 4 3 Round Brisk None       Visual Fields (Counting fingers)      Left Right    Full Full       Extraocular Movement      Right Left    Full Full       Neuro/Psych    Oriented x3: Yes   Mood/Affect: Normal       Dilation    Both eyes: 1.0% Mydriacyl, 2.5% Phenylephrine @ 9:32 AM        Slit Lamp and Fundus Exam    External Exam      Right Left   External Normal Normal       Slit Lamp Exam      Right Left   Lids/Lashes Normal Normal   Conjunctiva/Sclera White and quiet White and quiet   Cornea Clear Clear   Anterior Chamber Deep and quiet Deep and quiet   Iris Round and reactive Round and reactive   Lens Centered posterior chamber intraocular lens Centered posterior chamber intraocular lens   Anterior Vitreous Normal Normal       Fundus Exam      Right Left   Posterior Vitreous Posterior vitreous detachment VITRECTOMIZED   Disc Normal 1+ Optic disc atrophy, 1+ Pallor   C/D Ratio 0.45 0.7   Macula Normal Normal   Vessels Normal Normal   Periphery Normal Normal          IMAGING AND PROCEDURES  Imaging and Procedures for 07/15/20  OCT, Retina - OU - Both Eyes       Right Eye Quality was good. Scan locations included subfoveal. Central Foveal Thickness: 230. Progression has been stable. Findings include normal observations.   Left Eye Quality was good. Scan locations included subfoveal. Central Foveal Thickness: 224. Progression has been stable. Findings include abnormal foveal contour.   Notes PVD OD  OS, diffuse retinal atrophy likely from accommodation glaucoma and prior ILM peel for Macular hole and from the late 1990s                ASSESSMENT/PLAN:  Primary open angle glaucoma of left eye, severe  stage Patient maintained on latanoprost nightly left eye  and timolol twice daily left eye  Primary open angle glaucoma of right eye, moderate stage Patient maintained on latanoprost nightly right eye and timolol twice daily right eye  Lamellar macular hole of left eye Resolved OS, no reopening of hole looks great stable  Posterior vitreous detachment of right eye  The nature of posterior vitreous detachment was discussed with the patient as well as its physiology, its age prevalence, and its possible implication regarding retinal breaks and detachment.  An informational brochure was offered to the patient.  All the patient's questions were answered.  The patient was asked to return if new or different flashes or floaters develops.   Patient was instructed to contact office immediately if any new changes were noticed. I explained to the patient that vitreous inside the eye is similar to jello inside a bowl. As the jello melts it can start to pull away from the bowl, similarly the vitreous throughout our lives can begin to pull away from the retina. That process is called a posterior vitreous detachment. In some cases, the vitreous can tug hard enough on the retina to form a retinal tear. I discussed with the patient the signs and symptoms of a retinal detachment.  Do not rub the eye.        ICD-10-CM   1. Early stage nonexudative age-related macular degeneration of both eyes  H35.3131 OCT, Retina - OU - Both Eyes  2. Primary open angle glaucoma of left eye, severe stage  H40.1123   3. Primary open angle glaucoma of right eye, moderate stage  H40.1112   4. Lamellar macular hole of left eye  H35.342   5. Posterior vitreous detachment of right eye  H43.811     1.  Continue under the care of [facility] particularly for glaucoma  2.  No signs of recurrence of macular hole left eye and no imminent maculopathy right eye  3.  Very minor early stage ARMD, not high risk not requiring vitamin  supplementation simply, has a choice to use or not  Ophthalmic Meds Ordered this visit:  No orders of the defined types were placed in this encounter.      Return in about 1 year (around 07/15/2021) for DILATE OU, OCT.  There are no Patient Instructions on file for this visit.   Explained the diagnoses, plan, and follow up with the patient and they expressed understanding.  Patient expressed understanding of the importance of proper follow up care.   Clent Demark Khaliel Morey M.D. Diseases & Surgery of the Retina and Vitreous [facility] [place] 07/15/20     Abbreviations: M myopia (nearsighted); A astigmatism; H hyperopia (farsighted); P presbyopia; Mrx spectacle prescription;  CTL contact lenses; OD right eye; OS left eye; OU both eyes  XT exotropia; ET esotropia; PEK punctate epithelial keratitis; PEE punctate epithelial erosions; DES dry eye syndrome; MGD meibomian gland dysfunction; ATs artificial tears; PFAT's preservative free artificial tears; [place] nuclear sclerotic cataract; PSC posterior subcapsular cataract; ERM epi-retinal membrane; PVD posterior vitreous detachment; RD retinal detachment; DM diabetes mellitus; DR diabetic retinopathy; NPDR non-proliferative diabetic retinopathy; PDR proliferative diabetic retinopathy; CSME clinically significant macular edema; DME diabetic macular edema; dbh dot blot hemorrhages; CWS cotton wool spot; POAG primary open angle glaucoma; C/D cup-to-disc ratio; HVF humphrey visual field; GVF goldmann visual field; OCT optical coherence tomography; IOP intraocular pressure; BRVO Branch retinal vein occlusion; CRVO central retinal vein occlusion; CRAO central retinal artery occlusion; BRAO branch retinal artery occlusion; RT retinal tear;  SB scleral buckle; PPV pars plana vitrectomy; VH Vitreous hemorrhage; PRP panretinal laser photocoagulation; IVK intravitreal kenalog; VMT vitreomacular traction; MH Macular hole;  NVD neovascularization of the disc; NVE  neovascularization elsewhere; AREDS age related eye disease study; ARMD age related macular degeneration; POAG primary open angle glaucoma; EBMD epithelial/anterior basement membrane dystrophy; ACIOL anterior chamber intraocular lens; IOL intraocular lens; PCIOL posterior chamber intraocular lens; Phaco/IOL phacoemulsification with intraocular lens placement; [place] photorefractive keratectomy; LASIK laser assisted in situ keratomileusis; HTN hypertension; DM diabetes mellitus; COPD chronic obstructive pulmonary disease

## 2020-07-15 NOTE — Assessment & Plan Note (Signed)
Patient maintained on latanoprost nightly right eye and timolol twice daily right eye

## 2020-07-15 NOTE — Assessment & Plan Note (Signed)
Patient maintained on latanoprost nightly left eye and timolol twice daily left eye

## 2020-07-28 ENCOUNTER — Other Ambulatory Visit: Payer: Self-pay | Admitting: Internal Medicine

## 2020-09-03 ENCOUNTER — Ambulatory Visit
Admission: RE | Admit: 2020-09-03 | Discharge: 2020-09-03 | Disposition: A | Discharge status: Home / Self Care | Payer: Medicare Other | Source: Ambulatory Visit | Attending: Internal Medicine | Admitting: Internal Medicine

## 2020-09-03 ENCOUNTER — Other Ambulatory Visit: Payer: Self-pay

## 2020-09-03 DIAGNOSIS — Z1231 Encounter for screening mammogram for malignant neoplasm of breast: Secondary | ICD-10-CM

## 2020-10-07 ENCOUNTER — Other Ambulatory Visit: Payer: Self-pay | Admitting: Internal Medicine

## 2020-11-05 DIAGNOSIS — H401122 Primary open-angle glaucoma, left eye, moderate stage: Secondary | ICD-10-CM | POA: Diagnosis not present

## 2020-11-05 DIAGNOSIS — H35373 Puckering of macula, bilateral: Secondary | ICD-10-CM | POA: Diagnosis not present

## 2020-11-05 DIAGNOSIS — H04123 Dry eye syndrome of bilateral lacrimal glands: Secondary | ICD-10-CM | POA: Diagnosis not present

## 2021-01-05 ENCOUNTER — Other Ambulatory Visit: Payer: Medicare Other | Admitting: Internal Medicine

## 2021-01-05 ENCOUNTER — Other Ambulatory Visit: Payer: Self-pay

## 2021-01-05 DIAGNOSIS — E039 Hypothyroidism, unspecified: Secondary | ICD-10-CM

## 2021-01-05 DIAGNOSIS — E7800 Pure hypercholesterolemia, unspecified: Secondary | ICD-10-CM | POA: Diagnosis not present

## 2021-01-05 DIAGNOSIS — I10 Essential (primary) hypertension: Secondary | ICD-10-CM

## 2021-01-06 LAB — CBC WITH DIFFERENTIAL/PLATELET
Absolute Monocytes: 520 cells/uL (ref 200–950)
Basophils Absolute: 60 cells/uL (ref 0–200)
Basophils Relative: 1.2 %
Eosinophils Absolute: 120 cells/uL (ref 15–500)
Eosinophils Relative: 2.4 %
HCT: 42.9 % (ref 35.0–45.0)
Hemoglobin: 14.6 g/dL (ref 11.7–15.5)
Lymphs Abs: 1295 cells/uL (ref 850–3900)
MCH: 31.3 pg (ref 27.0–33.0)
MCHC: 34 g/dL (ref 32.0–36.0)
MCV: 91.9 fL (ref 80.0–100.0)
MPV: 9.2 fL (ref 7.5–12.5)
Monocytes Relative: 10.4 %
Neutro Abs: 3005 cells/uL (ref 1500–7800)
Neutrophils Relative %: 60.1 %
Platelets: 230 10*3/uL (ref 140–400)
RBC: 4.67 10*6/uL (ref 3.80–5.10)
RDW: 12.7 % (ref 11.0–15.0)
Total Lymphocyte: 25.9 %
WBC: 5 10*3/uL (ref 3.8–10.8)

## 2021-01-06 LAB — COMPLETE METABOLIC PANEL WITH GFR
AG Ratio: 1.8 (calc) (ref 1.0–2.5)
ALT: 18 U/L (ref 6–29)
AST: 21 U/L (ref 10–35)
Albumin: 4.2 g/dL (ref 3.6–5.1)
Alkaline phosphatase (APISO): 56 U/L (ref 37–153)
BUN: 11 mg/dL (ref 7–25)
CO2: 30 mmol/L (ref 20–32)
Calcium: 9.7 mg/dL (ref 8.6–10.4)
Chloride: 104 mmol/L (ref 98–110)
Creat: 0.64 mg/dL (ref 0.60–0.95)
Globulin: 2.4 g/dL (calc) (ref 1.9–3.7)
Glucose, Bld: 80 mg/dL (ref 65–99)
Potassium: 4.2 mmol/L (ref 3.5–5.3)
Sodium: 142 mmol/L (ref 135–146)
Total Bilirubin: 0.7 mg/dL (ref 0.2–1.2)
Total Protein: 6.6 g/dL (ref 6.1–8.1)
eGFR: 85 mL/min/{1.73_m2} (ref >=60)

## 2021-01-06 LAB — LIPID PANEL
Cholesterol: 208 mg/dL — ABNORMAL HIGH (ref <200)
HDL: 78 mg/dL (ref >=50)
LDL Cholesterol (Calc): 114 mg/dL (calc) — ABNORMAL HIGH
Non-HDL Cholesterol (Calc): 130 mg/dL (calc) — ABNORMAL HIGH (ref <130)
Total CHOL/HDL Ratio: 2.7 (calc) (ref <5.0)
Triglycerides: 68 mg/dL (ref <150)

## 2021-01-06 LAB — TSH: TSH: 2.59 mIU/L (ref 0.40–4.50)

## 2021-01-07 ENCOUNTER — Encounter: Payer: Self-pay | Admitting: Internal Medicine

## 2021-01-07 ENCOUNTER — Ambulatory Visit (INDEPENDENT_AMBULATORY_CARE_PROVIDER_SITE_OTHER): Payer: Medicare Other | Admitting: Internal Medicine

## 2021-01-07 ENCOUNTER — Other Ambulatory Visit: Payer: Self-pay

## 2021-01-07 VITALS — BP 130/82 | HR 69 | Resp 18 | Wt 110.0 lb

## 2021-01-07 DIAGNOSIS — G609 Hereditary and idiopathic neuropathy, unspecified: Secondary | ICD-10-CM

## 2021-01-07 DIAGNOSIS — E039 Hypothyroidism, unspecified: Secondary | ICD-10-CM

## 2021-01-07 DIAGNOSIS — D0512 Intraductal carcinoma in situ of left breast: Secondary | ICD-10-CM

## 2021-01-07 DIAGNOSIS — R8290 Unspecified abnormal findings in urine: Secondary | ICD-10-CM

## 2021-01-07 DIAGNOSIS — Z0000 Encounter for general adult medical examination without abnormal findings: Secondary | ICD-10-CM

## 2021-01-07 DIAGNOSIS — I10 Essential (primary) hypertension: Secondary | ICD-10-CM

## 2021-01-07 LAB — POCT URINALYSIS DIPSTICK
Bilirubin, UA: POSITIVE
Glucose, UA: NEGATIVE
Nitrite, UA: NEGATIVE
Protein, UA: POSITIVE — AB
Spec Grav, UA: 1.015 (ref 1.010–1.025)
Urobilinogen, UA: 0.2 E.U./dL
pH, UA: 6.5 (ref 5.0–8.0)
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
## 2021-02-08 ENCOUNTER — Encounter: Payer: Self-pay | Admitting: Internal Medicine

## 2021-02-08 NOTE — Patient Instructions (Signed)
It was a pleasure to see you today.  You have no symptoms of urinary tract infection.  We will continue to monitor your urine.  We have seen urine test positive for occult blood in the past.  Continue current medications and return in 1 year or as needed.  Continue with annual mammogram.

## 2021-04-19 ENCOUNTER — Other Ambulatory Visit: Payer: Self-pay | Admitting: Internal Medicine

## 2021-05-13 DIAGNOSIS — H04123 Dry eye syndrome of bilateral lacrimal glands: Secondary | ICD-10-CM | POA: Diagnosis not present

## 2021-05-13 DIAGNOSIS — H35373 Puckering of macula, bilateral: Secondary | ICD-10-CM | POA: Diagnosis not present

## 2021-05-13 DIAGNOSIS — H524 Presbyopia: Secondary | ICD-10-CM | POA: Diagnosis not present

## 2021-05-13 DIAGNOSIS — H401122 Primary open-angle glaucoma, left eye, moderate stage: Secondary | ICD-10-CM | POA: Diagnosis not present

## 2021-07-15 ENCOUNTER — Encounter (INDEPENDENT_AMBULATORY_CARE_PROVIDER_SITE_OTHER): Payer: Medicare Other | Admitting: Ophthalmology

## 2021-07-28 ENCOUNTER — Encounter (INDEPENDENT_AMBULATORY_CARE_PROVIDER_SITE_OTHER): Payer: Self-pay | Admitting: Ophthalmology

## 2021-07-28 ENCOUNTER — Encounter (INDEPENDENT_AMBULATORY_CARE_PROVIDER_SITE_OTHER): Payer: Medicare Other | Admitting: Ophthalmology

## 2021-07-28 ENCOUNTER — Ambulatory Visit (INDEPENDENT_AMBULATORY_CARE_PROVIDER_SITE_OTHER): Payer: Medicare Other | Admitting: Ophthalmology

## 2021-07-28 ENCOUNTER — Other Ambulatory Visit: Payer: Self-pay | Admitting: Internal Medicine

## 2021-07-28 DIAGNOSIS — H401123 Primary open-angle glaucoma, left eye, severe stage: Secondary | ICD-10-CM | POA: Diagnosis not present

## 2021-07-28 DIAGNOSIS — H43811 Vitreous degeneration, right eye: Secondary | ICD-10-CM | POA: Diagnosis not present

## 2021-07-28 DIAGNOSIS — H35342 Macular cyst, hole, or pseudohole, left eye: Secondary | ICD-10-CM

## 2021-07-28 DIAGNOSIS — H353131 Nonexudative age-related macular degeneration, bilateral, early dry stage: Secondary | ICD-10-CM

## 2021-07-28 DIAGNOSIS — Z1231 Encounter for screening mammogram for malignant neoplasm of breast: Secondary | ICD-10-CM

## 2021-07-28 NOTE — Assessment & Plan Note (Signed)
Stable OS 

## 2021-07-28 NOTE — Progress Notes (Signed)
07/28/2021     CHIEF COMPLAINT Patient presents for  Chief Complaint  Patient presents with   Macular Degeneration      HISTORY OF PRESENT ILLNESS: Christine Frost is a 88 y.o. female who presents to the clinic today for:   HPI   1 YR FU OU OCT. Pt stated vision has been stable since last visit. Pt denies new floaters and FOL. Pt is taking Latanoprost- 1 drop into both eyes at bedtime Timolol- 1 drop into both eyes in the morning Pt has allergy to PRED FORTE Last edited by Silvestre Moment on 07/28/2021  9:20 AM.      Referring physician: Elby Showers, MD [id number],  [place] [id number]  HISTORICAL INFORMATION:   Selected notes from the MEDICAL RECORD NUMBER       CURRENT MEDICATIONS: Current Outpatient Medications (Ophthalmic Drugs)  Medication Sig   latanoprost (XALATAN) 0.005 % ophthalmic solution Place 1 drop into both eyes at bedtime.    timolol (TIMOPTIC) 0.5 % ophthalmic solution    No current facility-administered medications for this visit. (Ophthalmic Drugs)   Current Outpatient Medications (Other)  Medication Sig   aspirin 81 MG tablet Take 81 mg by mouth daily.     Cholecalciferol (VITAMIN D-3) 1000 UNITS CAPS Take 1 capsule by mouth daily.   Cyanocobalamin 1000 MCG CAPS Take by mouth daily.   losartan (COZAAR) 50 MG tablet TAKE 1 TABLET(50 MG) BY MOUTH DAILY   Multiple Vitamins-Minerals (CENTRUM SILVER PO) Take by mouth.     Multiple Vitamins-Minerals (CENTRUM SILVER ULTRA WOMENS) TABS Take by mouth.   Omega-3 Fatty Acids (FISH OIL) 1200 MG CAPS Take 1 capsule by mouth 2 (two) times daily.    SYNTHROID 50 MCG tablet TAKE 1 TABLET(50 MCG) BY MOUTH DAILY   TURMERIC PO Take by mouth.   No current facility-administered medications for this visit. (Other)      REVIEW OF SYSTEMS: ROS   Negative for: Constitutional, Gastrointestinal, Neurological, Skin, Genitourinary, Musculoskeletal, HENT, Endocrine, Cardiovascular, Eyes, Respiratory,  Psychiatric, Allergic/Imm, Heme/Lymph Last edited by Silvestre Moment on 07/28/2021  9:19 AM.       ALLERGIES Allergies  Allergen Reactions   Penicillins Rash   Pred Forte [Prednisolone Acetate] Other (See Comments)    OPHTHALMIC - EXCESSIVE TEARING OF EYES    PAST MEDICAL HISTORY Past Medical History:  Diagnosis Date   Breast cancer ([place])    2016 left    Difficult intubation    Ductal carcinoma in situ (DCIS) of left breast 07/2014   History of cellulitis    right leg - finished antibiotic 09/14/2014   Hypertension    states does not have HTN, has been on Losartan since 2007   Hypothyroidism    Macular degeneration    left eye   Peripheral neuropathy    lower legs   Past Surgical History:  Procedure Laterality Date   ABDOMINAL HYSTERECTOMY  age 64   partial   BREAST LUMPECTOMY Left    2016 Malignant   BREAST LUMPECTOMY WITH RADIOACTIVE SEED LOCALIZATION Left 09/24/2014   Procedure: LEFT BREAST LUMPECTOMY WITH RADIOACTIVE SEED LOCALIZATION;  Surgeon: Rolm Bookbinder, MD;  Location: [facility];  Service: General;  Laterality: Left;   CATARACT EXTRACTION W/ INTRAOCULAR LENS  IMPLANT, BILATERAL Bilateral    CHOLECYSTECTOMY  11/02/2001   EXCISION MORTON'S NEUROMA Bilateral 1974   KNEE ARTHROSCOPY Left 01/06/2009   PARS PLANA VITRECTOMY W/ REPAIR OF MACULAR HOLE Left 08/1996 & 12/1996  PILONIDAL CYST EXCISION  1953    FAMILY HISTORY Family History  Problem Relation Age of Onset   Heart disease Mother    Stroke Mother    Hypertension Mother    Alcohol abuse Father    Heart disease Brother    Melanoma Daughter     SOCIAL HISTORY Social History   Tobacco Use   Smoking status: Never   Smokeless tobacco: Never  Substance Use Topics   Alcohol use: Yes    Alcohol/week: 0.0 standard drinks of alcohol    Comment: 1 glass of wine per day   Drug use: No         OPHTHALMIC EXAM:  Base Eye Exam     Visual Acuity (ETDRS)       Right Left   Dist Man 20/25  -1 +1 20/80   Dist ph [place]  20/60 -2  Pt stated line 20/100 the letters started flashing and nothing is on a straight line.         Tonometry (Tonopen, 9:27 AM)       Right Left   Pressure 12 12         Pupils       Pupils Dark Light Shape React APD   Right PERRL 3 3 Round Slow None   Left PERRL 3 3 Round Slow None         Visual Fields       Left Right    Full Full         Extraocular Movement       Right Left    Full Full         Neuro/Psych     Oriented x3: Yes   Mood/Affect: Normal         Dilation     Both eyes: 1.0% Mydriacyl, 2.5% Phenylephrine @ 9:27 AM           Slit Lamp and Fundus Exam     External Exam       Right Left   External Normal Normal         Slit Lamp Exam       Right Left   Lids/Lashes Normal Normal   Conjunctiva/Sclera White and quiet White and quiet   Cornea Clear Clear   Anterior Chamber Deep and quiet Deep and quiet   Iris Round and reactive Round and reactive   Lens Centered posterior chamber intraocular lens Centered posterior chamber intraocular lens   Anterior Vitreous Normal Normal         Fundus Exam       Right Left   Posterior Vitreous Posterior vitreous detachment VITRECTOMIZED   Disc Normal 1+ Optic disc atrophy, 1+ Pallor   C/D Ratio 0.45 0.7   Macula Normal Normal   Vessels Normal Normal   Periphery Normal Normal            IMAGING AND PROCEDURES  Imaging and Procedures for 07/28/21  OCT, Retina - OU - Both Eyes       Right Eye Quality was good. Scan locations included subfoveal. Central Foveal Thickness: 229. Progression has been stable. Findings include normal observations.   Left Eye Quality was good. Scan locations included subfoveal. Central Foveal Thickness: 224. Progression has been stable. Findings include abnormal foveal contour.   Notes PVD OD  OS, diffuse retinal atrophy likely from accommodation glaucoma and prior ILM peel for Macular hole and from the late  1990s  ASSESSMENT/PLAN:  Posterior vitreous detachment of right eye  The nature of posterior vitreous detachment was discussed with the patient as well as its physiology, its age prevalence, and its possible implication regarding retinal breaks and detachment.  An informational brochure was offered to the patient.  All the patient's questions were answered.  The patient was asked to return if new or different flashes or floaters develops.   Patient was instructed to contact office immediately if any new changes were noticed. I explained to the patient that vitreous inside the eye is similar to jello inside a bowl. As the jello melts it can start to pull away from the bowl, similarly the vitreous throughout our lives can begin to pull away from the retina. That process is called a posterior vitreous detachment. In some cases, the vitreous can tug hard enough on the retina to form a retinal tear. I discussed with the patient the signs and symptoms of a retinal detachment.  Do not rub the eye.    Early stage nonexudative age-related macular degeneration of both eyes The nature of age--related macular degeneration was discussed with the patient as well as the distinction between dry and wet types. Checking an Amsler Grid daily with advice to return immediately should a distortion develop, was given to the patient. The patient 's smoking status now and in the past was determined and advice based on the AREDS study was provided regarding the consumption of antioxidant supplements. AREDS 2 vitamin formulation was recommended. Consumption of dark leafy vegetables and fresh fruits of various colors was recommended. Treatment modalities for wet macular degeneration particularly the use of intravitreal injections of anti-blood vessel growth factors was discussed with the patient. Avastin, Lucentis, and Eylea are the available options. On occasion, therapy includes the use of photodynamic therapy  and thermal laser. Stressed to the patient do not rub eyes.  Patient was advised to check Amsler Grid daily and return immediately if changes are noted. Instructions on using the grid were given to the patient. All patient questions were answered.  Lamellar macular hole of left eye Stable OS  Primary open angle glaucoma of left eye, severe stage Under the care of Dr. Bing Plume, on latanoprost and timolol left eye      ICD-10-CM   1. Early stage nonexudative age-related macular degeneration of both eyes  H35.3131 OCT, Retina - OU - Both Eyes    2. Posterior vitreous detachment of right eye  H43.811     3. Lamellar macular hole of left eye  H35.342     4. Primary open angle glaucoma of left eye, severe stage  H40.1123       OU with mild AMD.  No progressive changes, not high risk.  2.  OS history of macular hole repair mid to late 90s with vitrectomy ILM peel gas injection with improvement acuity  3.  OAG continues under the management Dr. Bing Plume, [facility]  Ophthalmic Meds Ordered this visit:  No orders of the defined types were placed in this encounter.      Return in about 1 year (around 07/29/2022) for DILATE OU, OCT.  There are no Patient Instructions on file for this visit.   Explained the diagnoses, plan, and follow up with the patient and they expressed understanding.  Patient expressed understanding of the importance of proper follow up care.   Clent Demark Jonice Cerra M.D. Diseases & Surgery of the Retina and Vitreous [facility] [place] 07/28/21     Abbreviations:  M myopia (nearsighted); A astigmatism; H hyperopia (farsighted); P presbyopia; Mrx spectacle prescription;  CTL contact lenses; OD right eye; OS left eye; OU both eyes  XT exotropia; ET esotropia; PEK punctate epithelial keratitis; PEE punctate epithelial erosions; DES dry eye syndrome; MGD meibomian gland dysfunction; ATs artificial tears; PFAT's preservative free artificial tears; [place] nuclear  sclerotic cataract; PSC posterior subcapsular cataract; ERM epi-retinal membrane; PVD posterior vitreous detachment; RD retinal detachment; DM diabetes mellitus; DR diabetic retinopathy; NPDR non-proliferative diabetic retinopathy; PDR proliferative diabetic retinopathy; CSME clinically significant macular edema; DME diabetic macular edema; dbh dot blot hemorrhages; CWS cotton wool spot; POAG primary open angle glaucoma; C/D cup-to-disc ratio; HVF humphrey visual field; GVF goldmann visual field; OCT optical coherence tomography; IOP intraocular pressure; BRVO Branch retinal vein occlusion; CRVO central retinal vein occlusion; CRAO central retinal artery occlusion; BRAO branch retinal artery occlusion; RT retinal tear; SB scleral buckle; PPV pars plana vitrectomy; VH Vitreous hemorrhage; PRP panretinal laser photocoagulation; IVK intravitreal kenalog; VMT vitreomacular traction; MH Macular hole;  NVD neovascularization of the disc; NVE neovascularization elsewhere; AREDS age related eye disease study; ARMD age related macular degeneration; POAG primary open angle glaucoma; EBMD epithelial/anterior basement membrane dystrophy; ACIOL anterior chamber intraocular lens; IOL intraocular lens; PCIOL posterior chamber intraocular lens; Phaco/IOL phacoemulsification with intraocular lens placement; [place] photorefractive keratectomy; LASIK laser assisted in situ keratomileusis; HTN hypertension; DM diabetes mellitus; COPD chronic obstructive pulmonary disease

## 2021-07-28 NOTE — Assessment & Plan Note (Signed)

## 2021-07-28 NOTE — Assessment & Plan Note (Signed)

## 2021-07-28 NOTE — Assessment & Plan Note (Signed)
Under the care of Dr. Bing Plume, on latanoprost and timolol left eye

## 2021-09-07 ENCOUNTER — Ambulatory Visit: Payer: Medicare Other

## 2021-09-15 ENCOUNTER — Ambulatory Visit
Admission: RE | Admit: 2021-09-15 | Discharge: 2021-09-15 | Disposition: A | Discharge status: Home / Self Care | Payer: Medicare Other | Source: Ambulatory Visit | Attending: Internal Medicine | Admitting: Internal Medicine

## 2021-09-15 DIAGNOSIS — Z1231 Encounter for screening mammogram for malignant neoplasm of breast: Secondary | ICD-10-CM | POA: Diagnosis not present

## 2021-10-13 ENCOUNTER — Other Ambulatory Visit: Payer: Self-pay | Admitting: Internal Medicine

## 2021-10-19 ENCOUNTER — Other Ambulatory Visit: Payer: Self-pay | Admitting: Internal Medicine

## 2021-11-25 DIAGNOSIS — H35373 Puckering of macula, bilateral: Secondary | ICD-10-CM | POA: Diagnosis not present

## 2021-11-25 DIAGNOSIS — H04123 Dry eye syndrome of bilateral lacrimal glands: Secondary | ICD-10-CM | POA: Diagnosis not present

## 2021-11-25 DIAGNOSIS — H524 Presbyopia: Secondary | ICD-10-CM | POA: Diagnosis not present

## 2021-11-25 DIAGNOSIS — H401122 Primary open-angle glaucoma, left eye, moderate stage: Secondary | ICD-10-CM | POA: Diagnosis not present

## 2022-01-07 ENCOUNTER — Other Ambulatory Visit: Payer: Medicare Other

## 2022-01-07 DIAGNOSIS — E039 Hypothyroidism, unspecified: Secondary | ICD-10-CM

## 2022-01-07 DIAGNOSIS — I10 Essential (primary) hypertension: Secondary | ICD-10-CM

## 2022-01-07 DIAGNOSIS — E785 Hyperlipidemia, unspecified: Secondary | ICD-10-CM

## 2022-01-08 LAB — COMPLETE METABOLIC PANEL WITH GFR
AG Ratio: 1.7 (calc) (ref 1.0–2.5)
ALT: 18 U/L (ref 6–29)
AST: 21 U/L (ref 10–35)
Albumin: 4.3 g/dL (ref 3.6–5.1)
Alkaline phosphatase (APISO): 59 U/L (ref 37–153)
BUN: 11 mg/dL (ref 7–25)
CO2: 26 mmol/L (ref 20–32)
Calcium: 9.8 mg/dL (ref 8.6–10.4)
Chloride: 104 mmol/L (ref 98–110)
Creat: 0.62 mg/dL (ref 0.60–0.95)
Globulin: 2.5 g/dL (calc) (ref 1.9–3.7)
Glucose, Bld: 85 mg/dL (ref 65–99)
Potassium: 4.6 mmol/L (ref 3.5–5.3)
Sodium: 144 mmol/L (ref 135–146)
Total Bilirubin: 0.6 mg/dL (ref 0.2–1.2)
Total Protein: 6.8 g/dL (ref 6.1–8.1)
eGFR: 85 mL/min/{1.73_m2} (ref >=60)

## 2022-01-08 LAB — CBC WITH DIFFERENTIAL/PLATELET
Absolute Monocytes: 451 cells/uL (ref 200–950)
Basophils Absolute: 60 cells/uL (ref 0–200)
Basophils Relative: 1.3 %
Eosinophils Absolute: 120 cells/uL (ref 15–500)
Eosinophils Relative: 2.6 %
HCT: 43.4 % (ref 35.0–45.0)
Hemoglobin: 15.1 g/dL (ref 11.7–15.5)
Lymphs Abs: 1398 cells/uL (ref 850–3900)
MCH: 32.1 pg (ref 27.0–33.0)
MCHC: 34.8 g/dL (ref 32.0–36.0)
MCV: 92.1 fL (ref 80.0–100.0)
MPV: 9.3 fL (ref 7.5–12.5)
Monocytes Relative: 9.8 %
Neutro Abs: 2571 cells/uL (ref 1500–7800)
Neutrophils Relative %: 55.9 %
Platelets: 213 10*3/uL (ref 140–400)
RBC: 4.71 10*6/uL (ref 3.80–5.10)
RDW: 13 % (ref 11.0–15.0)
Total Lymphocyte: 30.4 %
WBC: 4.6 10*3/uL (ref 3.8–10.8)

## 2022-01-08 LAB — LIPID PANEL
Cholesterol: 221 mg/dL — ABNORMAL HIGH (ref <200)
HDL: 81 mg/dL (ref >=50)
LDL Cholesterol (Calc): 120 mg/dL (calc) — ABNORMAL HIGH
Non-HDL Cholesterol (Calc): 140 mg/dL (calc) — ABNORMAL HIGH (ref <130)
Total CHOL/HDL Ratio: 2.7 (calc) (ref <5.0)
Triglycerides: 94 mg/dL (ref <150)

## 2022-01-08 LAB — TSH: TSH: 2.52 mIU/L (ref 0.40–4.50)

## 2022-01-10 ENCOUNTER — Ambulatory Visit (INDEPENDENT_AMBULATORY_CARE_PROVIDER_SITE_OTHER): Payer: Medicare Other | Admitting: Internal Medicine

## 2022-01-10 ENCOUNTER — Encounter: Payer: Self-pay | Admitting: Internal Medicine

## 2022-01-10 VITALS — BP 120/66 | HR 84 | Temp 98.7°F | Ht 63.0 in | Wt 107.2 lb

## 2022-01-10 DIAGNOSIS — Z86000 Personal history of in-situ neoplasm of breast: Secondary | ICD-10-CM | POA: Diagnosis not present

## 2022-01-10 DIAGNOSIS — I10 Essential (primary) hypertension: Secondary | ICD-10-CM

## 2022-01-10 DIAGNOSIS — Z0000 Encounter for general adult medical examination without abnormal findings: Secondary | ICD-10-CM

## 2022-01-10 DIAGNOSIS — H35342 Macular cyst, hole, or pseudohole, left eye: Secondary | ICD-10-CM | POA: Diagnosis not present

## 2022-01-10 DIAGNOSIS — Z1211 Encounter for screening for malignant neoplasm of colon: Secondary | ICD-10-CM

## 2022-01-10 DIAGNOSIS — H3530 Unspecified macular degeneration: Secondary | ICD-10-CM | POA: Diagnosis not present

## 2022-01-10 DIAGNOSIS — E039 Hypothyroidism, unspecified: Secondary | ICD-10-CM | POA: Diagnosis not present

## 2022-01-10 DIAGNOSIS — H401123 Primary open-angle glaucoma, left eye, severe stage: Secondary | ICD-10-CM

## 2022-01-10 LAB — POCT URINALYSIS DIPSTICK
Bilirubin, UA: NEGATIVE
Glucose, UA: NEGATIVE
Ketones, UA: NEGATIVE
Leukocytes, UA: NEGATIVE
Nitrite, UA: NEGATIVE
Protein, UA: NEGATIVE
Spec Grav, UA: 1.015 (ref 1.010–1.025)
Urobilinogen, UA: 0.2 E.U./dL
pH, UA: 5 (ref 5.0–8.0)

## 2022-01-10 NOTE — Progress Notes (Deleted)
   Subjective:    Patient ID: Christine Frost, female    DOB: [birth year], 88 y.o.   MRN: [id number]  HPI 88 year old Female seen for health maintenance exam and evaluation of medical issues.Vaccines discussed. Not inclined to take any  more vaccines, she says. Meds discussed. Uses Xalatan per Dr. Bing Plume for glaucoma.    Review of Systems     Objective:   Physical Exam        Assessment & Plan:
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
## 2022-03-31 ENCOUNTER — Emergency Department (HOSPITAL_BASED_OUTPATIENT_CLINIC_OR_DEPARTMENT_OTHER)
Admission: EM | Admit: 2022-03-31 | Discharge: 2022-03-31 | Disposition: A | Discharge status: Home / Self Care | Payer: Medicare Other | Attending: Emergency Medicine | Admitting: Emergency Medicine

## 2022-03-31 ENCOUNTER — Emergency Department (HOSPITAL_BASED_OUTPATIENT_CLINIC_OR_DEPARTMENT_OTHER): Payer: Medicare Other

## 2022-03-31 ENCOUNTER — Ambulatory Visit (INDEPENDENT_AMBULATORY_CARE_PROVIDER_SITE_OTHER): Payer: Medicare Other | Admitting: Internal Medicine

## 2022-03-31 ENCOUNTER — Encounter: Payer: Self-pay | Admitting: Internal Medicine

## 2022-03-31 ENCOUNTER — Encounter (HOSPITAL_BASED_OUTPATIENT_CLINIC_OR_DEPARTMENT_OTHER): Payer: Self-pay

## 2022-03-31 ENCOUNTER — Telehealth: Payer: Self-pay | Admitting: Internal Medicine

## 2022-03-31 ENCOUNTER — Other Ambulatory Visit: Payer: Self-pay

## 2022-03-31 VITALS — BP 130/70 | HR 93 | Temp 98.0°F | Ht 63.0 in | Wt 109.0 lb

## 2022-03-31 DIAGNOSIS — R7309 Other abnormal glucose: Secondary | ICD-10-CM | POA: Diagnosis not present

## 2022-03-31 DIAGNOSIS — Z853 Personal history of malignant neoplasm of breast: Secondary | ICD-10-CM | POA: Insufficient documentation

## 2022-03-31 DIAGNOSIS — E039 Hypothyroidism, unspecified: Secondary | ICD-10-CM

## 2022-03-31 DIAGNOSIS — J3489 Other specified disorders of nose and nasal sinuses: Secondary | ICD-10-CM | POA: Diagnosis not present

## 2022-03-31 DIAGNOSIS — Z79899 Other long term (current) drug therapy: Secondary | ICD-10-CM | POA: Insufficient documentation

## 2022-03-31 DIAGNOSIS — R42 Dizziness and giddiness: Secondary | ICD-10-CM | POA: Insufficient documentation

## 2022-03-31 DIAGNOSIS — Z7982 Long term (current) use of aspirin: Secondary | ICD-10-CM | POA: Insufficient documentation

## 2022-03-31 DIAGNOSIS — G609 Hereditary and idiopathic neuropathy, unspecified: Secondary | ICD-10-CM

## 2022-03-31 DIAGNOSIS — I10 Essential (primary) hypertension: Secondary | ICD-10-CM | POA: Diagnosis not present

## 2022-03-31 DIAGNOSIS — Z86000 Personal history of in-situ neoplasm of breast: Secondary | ICD-10-CM

## 2022-03-31 LAB — COMPREHENSIVE METABOLIC PANEL
ALT: 21 U/L (ref 0–44)
AST: 20 U/L (ref 15–41)
Albumin: 4.7 g/dL (ref 3.5–5.0)
Alkaline Phosphatase: 53 U/L (ref 38–126)
Anion gap: 14 (ref 5–15)
BUN: 9 mg/dL (ref 8–23)
CO2: 24 mmol/L (ref 22–32)
Calcium: 10.3 mg/dL (ref 8.9–10.3)
Chloride: 104 mmol/L (ref 98–111)
Creatinine, Ser: 0.58 mg/dL (ref 0.44–1.00)
GFR, Estimated: >60 mL/min (ref >60)
Glucose, Bld: 110 mg/dL — ABNORMAL HIGH (ref 70–99)
Potassium: 3.8 mmol/L (ref 3.5–5.1)
Sodium: 142 mmol/L (ref 135–145)
Total Bilirubin: 0.7 mg/dL (ref 0.3–1.2)
Total Protein: 7.4 g/dL (ref 6.5–8.1)

## 2022-03-31 LAB — CBC
HCT: 43.4 % (ref 36.0–46.0)
Hemoglobin: 15 g/dL (ref 12.0–15.0)
MCH: 31.7 pg (ref 26.0–34.0)
MCHC: 34.6 g/dL (ref 30.0–36.0)
MCV: 91.8 fL (ref 80.0–100.0)
Platelets: 203 10*3/uL (ref 150–400)
RBC: 4.73 MIL/uL (ref 3.87–5.11)
RDW: 13 % (ref 11.5–15.5)
WBC: 6.6 10*3/uL (ref 4.0–10.5)
nRBC: 0 % (ref 0.0–0.2)

## 2022-03-31 LAB — DIFFERENTIAL
Abs Immature Granulocytes: 0.02 10*3/uL (ref 0.00–0.07)
Basophils Absolute: 0.1 10*3/uL (ref 0.0–0.1)
Basophils Relative: 1 %
Eosinophils Absolute: 0 10*3/uL (ref 0.0–0.5)
Eosinophils Relative: 0 %
Immature Granulocytes: 0 %
Lymphocytes Relative: 14 %
Lymphs Abs: 0.9 10*3/uL (ref 0.7–4.0)
Monocytes Absolute: 0.4 10*3/uL (ref 0.1–1.0)
Monocytes Relative: 6 %
Neutro Abs: 5.2 10*3/uL (ref 1.7–7.7)
Neutrophils Relative %: 79 %

## 2022-03-31 LAB — CBG MONITORING, ED: Glucose-Capillary: 101 mg/dL — ABNORMAL HIGH (ref 70–99)

## 2022-03-31 LAB — APTT: aPTT: 28 seconds (ref 24–36)

## 2022-03-31 LAB — ETHANOL: Alcohol, Ethyl (B): <10 mg/dL (ref <10)

## 2022-03-31 LAB — PROTIME-INR
INR: 0.9 (ref 0.8–1.2)
Prothrombin Time: 12.4 seconds (ref 11.4–15.2)

## 2022-03-31 MED ORDER — SODIUM CHLORIDE 0.9% FLUSH
3.0000 mL | Freq: Once | INTRAVENOUS | Status: AC
  Administered 2022-03-31: 3 mL via INTRAVENOUS

## 2022-03-31 MED ORDER — MECLIZINE HCL 12.5 MG PO TABS: 12.5000 mg | ORAL_TABLET | Freq: Four times a day (QID) | ORAL | 0 refills | Status: DC | PRN

## 2022-03-31 MED ORDER — MECLIZINE HCL 25 MG PO TABS
12.5000 mg | ORAL_TABLET | Freq: Once | ORAL | Status: AC
  Administered 2022-03-31: 12.5 mg via ORAL
  Filled 2022-03-31: qty 1

## 2022-03-31 NOTE — ED Provider Notes (Signed)
[facility] Provider Note   CSN: [id number] Arrival date & time: 03/31/22  1608     History  Chief Complaint  Patient presents with   Dizziness    Christine Frost is a 89 y.o. female with history of macular degeneration, hypothyroidism, peripheral neuropathy, hypertension, breast cancer who presents the emergency department complaining of dizziness.  Patient states she woke up this morning and when she got out of bed and was walking to the bathroom she felt very dizzy, fell into the door on her left side.  States she was able to catch herself, did not fall to the ground or strike her head.  She also had some tingling in her bilateral hands, this is resolved at this time.  The dizziness has mostly resolved, but she says her head "still does not feel right".  She called her doctor and they had her come in for a visit.  They recommended that she come to the ER and have an MRI.  She denies ever having similar symptoms.   Dizziness Associated symptoms: no chest pain, no headaches, no palpitations and no weakness        Home Medications Prior to Admission medications   Medication Sig Start Date End Date Taking? Authorizing Provider  meclizine (ANTIVERT) 12.5 MG tablet Take 1 tablet (12.5 mg total) by mouth every 6 (six) hours as needed for dizziness. 03/31/22  Yes Kanan Sobek T, PA-C  aspirin 81 MG tablet Take 81 mg by mouth daily.      [provider]  Cholecalciferol (VITAMIN D-3) 1000 UNITS CAPS Take 1 capsule by mouth daily.    [provider]  Cyanocobalamin 1000 MCG CAPS Take by mouth daily.    [provider]  latanoprost (XALATAN) 0.005 % ophthalmic solution Place 1 drop into both eyes at bedtime.  03/21/14   [provider]  losartan (COZAAR) 50 MG tablet TAKE 1 TABLET(50 MG) BY MOUTH DAILY 10/14/21   Baxley, Cresenciano Lick, MD  Multiple Vitamins-Minerals (CENTRUM SILVER ULTRA WOMENS) TABS Take by mouth.     [provider]  Omega-3 Fatty Acids (FISH OIL) 1200 MG CAPS Take 1 capsule by mouth 2 (two) times daily.     [provider]  SYNTHROID 50 MCG tablet TAKE 1 TABLET(50 MCG) BY MOUTH DAILY 10/20/21   Elby Showers, MD  timolol (TIMOPTIC) 0.5 % ophthalmic solution  03/21/19   [provider]  TURMERIC PO Take by mouth.    [provider]      Allergies    Penicillins and Pred forte [prednisolone acetate]    Review of Systems   Review of Systems  Cardiovascular:  Negative for chest pain and palpitations.  Neurological:  Positive for dizziness and numbness. Negative for tremors, speech difficulty, weakness, light-headedness and headaches.  All other systems reviewed and are negative.   Physical Exam Updated Vital Signs BP (!) 177/80 (BP Location: Left Arm)   Pulse 86   Temp 98.4 F (36.9 C) (Oral)   Resp 20   Ht 5' 3"$  (1.6 m)   Wt 49.4 kg   SpO2 97%   BMI 19.31 kg/m  Physical Exam Vitals and nursing note reviewed.  Constitutional:      Appearance: Normal appearance.  HENT:     Head: Normocephalic and atraumatic.     Right Ear: Hearing, tympanic membrane, ear canal and external ear normal.     Left Ear: Hearing, tympanic membrane, ear canal and external  ear normal.  Eyes:     Conjunctiva/sclera: Conjunctivae normal.  Cardiovascular:     Rate and Rhythm: Normal rate and regular rhythm.  Pulmonary:     Effort: Pulmonary effort is normal. No respiratory distress.     Breath sounds: Normal breath sounds.  Abdominal:     General: There is no distension.     Palpations: Abdomen is soft.     Tenderness: There is no abdominal tenderness.  Skin:    General: Skin is warm and dry.  Neurological:     General: No focal deficit present.     Mental Status: She is alert.     Comments: Neuro: Speech is clear, able to follow commands. CN III-XII intact grossly intact. PERRLA. EOMI. Sensation intact throughout. Str 5/5 all extremities.  Horizontal  nystagmus noted intermittently, noted when patient was complaining of dizziness, nystagmus resolved when feelings of dizziness resolved.     ED Results / Procedures / Treatments   Labs (all labs ordered are listed, but only abnormal results are displayed) Labs Reviewed  COMPREHENSIVE METABOLIC PANEL - Abnormal; Notable for the following components:      Result Value   Glucose, Bld 110 (*)    All other components within normal limits  CBG MONITORING, ED - Abnormal; Notable for the following components:   Glucose-Capillary 101 (*)    All other components within normal limits  PROTIME-INR  APTT  CBC  DIFFERENTIAL  ETHANOL    EKG None  Radiology MR BRAIN WO CONTRAST  Result Date: 03/31/2022 CLINICAL DATA:  Vertigo. EXAM: MRI HEAD WITHOUT CONTRAST TECHNIQUE: Multiplanar, multiecho pulse sequences of the brain and surrounding structures were obtained without intravenous contrast. COMPARISON:  None Available. FINDINGS: Brain: No acute infarction, hemorrhage, hydrocephalus, extra-axial collection or mass lesion. Moderate patchy T2/FLAIR hyperintensities within the white matter, nonspecific but compatible with chronic microvascular ischemic disease. Vascular: Major arterial flow voids are maintained at the skull base. Skull and upper cervical spine: Normal marrow signal. Sinuses/Orbits: Right maxillary sinus mucosal thickening. No acute orbital findings. Other: No mastoid effusions. IMPRESSION: 1. No acute intracranial abnormality. 2. Chronic microvascular ischemic disease. 3. Right maxillary paranasal sinus disease. Electronically Signed   By: Margaretha Sheffield M.D.   On: 03/31/2022 17:28    Procedures Procedures    Medications Ordered in ED Medications  sodium chloride flush (NS) 0.9 % injection 3 mL (3 mLs Intravenous Given 03/31/22 1643)  meclizine (ANTIVERT) tablet 12.5 mg (12.5 mg Oral Given 03/31/22 1742)    ED Course/ Medical Decision Making/ A&P                              Medical Decision Making Amount and/or Complexity of Data Reviewed Labs: ordered. Radiology: ordered.   This patient is a 89 y.o. female  who presents to the ED for concern of dizziness.   Differential diagnoses prior to evaluation: The emergent differential diagnosis includes, but is not limited to,  BPPV, vestibular migraine, head trauma, AVM, intracranial tumor, multiple sclerosis, drug-related, CVA, vasovagal syncope, orthostatic hypotension, sepsis, hypoglycemia, electrolyte disturbance, anemia, anxiety/panic attack . This is not an exhaustive differential.   Past Medical History / Co-morbidities: macular degeneration, hypothyroidism, peripheral neuropathy, hypertension, breast cancer  Additional history: Chart reviewed. Pertinent results include: PCP visit from earlier this afternoon reviewed.  At that time patient's symptoms had mostly resolved, but her primary doctor recommended that she go to the ER for an MRI.  Unclear whether  or not they had ordered an MRI specifically or if they wanted this done at the ER, cannot find record of the order.  Physical Exam: Physical exam performed. The pertinent findings include: Hypertensive, otherwise normal vital signs.  In no acute distress.  Horizontal nystagmus noted while patient was complaining of dizziness.  Diagnosed resolved when dizziness resolved.  Dizziness exacerbated by raising head up off the bed.  Otherwise neurologic exam normal as documented above.  Lab Tests/Imaging studies: I personally interpreted labs/imaging and the pertinent results include: CBC and CMP unremarkable.  Negative ethanol. MRI with chronic microvascular ischemic disease and right maxillary paranasal sinus disease, no acute abnormalities. I agree with the radiologist interpretation.  Cardiac monitoring: EKG obtained and interpreted by my attending physician which shows: Sinus rhythm, borderline ST depression   Medications: I ordered medication including  meclizine.  I have reviewed the patients home medicines and have made adjustments as needed.  On reevaluation, patient states her symptoms have resolved and she would like to go home. Was able to change positions several time in exam bed without reoccurrence of symptoms.    Disposition: After consideration of the diagnostic results and the patients response to treatment, I feel that emergency department workup does not suggest an emergent condition requiring admission or immediate intervention beyond what has been performed at this time. The plan is: discharge to home with short course of meclizine for likely peripheral vertigo. Symptoms have resolved and MRI is normal. Recommend further PCP follow up as needed/previously scheduled. The patient is safe for discharge and has been instructed to return immediately for worsening symptoms, change in symptoms or any other concerns.  I discussed this case with my attending physician Dr. Alvino Chapel who cosigned this note including patient's presenting symptoms, physical exam, and planned diagnostics and interventions. Attending physician stated agreement with plan or made changes to plan which were implemented.   Final Clinical Impression(s) / ED Diagnoses Final diagnoses:  Dizziness  Vertigo    Rx / DC Orders ED Discharge Orders          Ordered    meclizine (ANTIVERT) 12.5 MG tablet  Every 6 hours PRN        03/31/22 1845           Portions of this report may have been transcribed using voice recognition software. Every effort was made to ensure accuracy; however, inadvertent computerized transcription errors may be present.    Estill Cotta 03/31/22 Donia Ast, MD 03/31/22 [phone]

## 2022-03-31 NOTE — ED Notes (Signed)
Patient transported to MRI 

## 2022-03-31 NOTE — Telephone Encounter (Signed)
Christine Frost [phone]  JoAnne called to say she Woke up dizzy, just don't feel right this morning, she has drank some coffee and eat some jelly toast and that has not helped so I scheduled her to come in around 2:15 to be seen. She is going to have someone bring her.
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
## 2022-03-31 NOTE — ED Notes (Signed)
Complaint of vertigo x2 while in room assessing Pt. Both episodes cleared up on its own.

## 2022-03-31 NOTE — ED Triage Notes (Signed)
Pt ambulatory to er room number 13, pt states that when she woke up this am she felt a little bit confused and dizzy, states that when she walked to the bathroom she lost her balance and almost fell.  States that she caught herself and could then focus, but her gait has been off today.  States that her pmd told her to go to the er to be seen.

## 2022-03-31 NOTE — Discharge Instructions (Addendum)
You were seen in the ER for dizziness.  As we discussed, your blood work and MRI imaging was all reassuring today! I think your symptoms are likely the result of vertigo, and I'm glad you feel better after the medicine we gave you.  I'm prescribing you some of the same medicine you received here. You can take this every 6 hours as needed for dizziness.  Continue to monitor how you're doing and return to the ER for new or worsening symptoms.

## 2022-04-01 NOTE — Patient Instructions (Signed)
Proceed to [facility] for evaluation in the emergency department.  I am concerned that her dizziness represents a TIA or CVA.  Would like for patient to have imaging of her brain urgently.

## 2022-04-04 ENCOUNTER — Ambulatory Visit (INDEPENDENT_AMBULATORY_CARE_PROVIDER_SITE_OTHER): Payer: Medicare Other | Admitting: Internal Medicine

## 2022-04-04 ENCOUNTER — Encounter: Payer: Self-pay | Admitting: Internal Medicine

## 2022-04-04 ENCOUNTER — Telehealth: Payer: Self-pay

## 2022-04-04 VITALS — BP 126/70 | HR 66 | Temp 97.9°F | Ht 63.0 in | Wt 109.4 lb

## 2022-04-04 DIAGNOSIS — I10 Essential (primary) hypertension: Secondary | ICD-10-CM

## 2022-04-04 DIAGNOSIS — E039 Hypothyroidism, unspecified: Secondary | ICD-10-CM | POA: Diagnosis not present

## 2022-04-04 DIAGNOSIS — R42 Dizziness and giddiness: Secondary | ICD-10-CM | POA: Diagnosis not present

## 2022-04-04 MED ORDER — MECLIZINE HCL 12.5 MG PO TABS: 12.5000 mg | ORAL_TABLET | Freq: Three times a day (TID) | ORAL | 0 refills | Status: AC | PRN

## 2022-04-04 NOTE — Telephone Encounter (Signed)
Patient called stating after being seen at [facility] she felt better but on Sunday she bent over and she started having dizziness, lightheaded and tingling hands she took Ativan that was prescribed and after an hour she began to feel a little better but not %100.  Patient thinks her symptoms are possibly inner ear or carotid artery.  Patient would like to talk to Dr. Renold Genta over the phone or she can come into the office. She also asked if a referral is needed to see a specialist.

## 2022-04-04 NOTE — Progress Notes (Signed)
Patient Care Team: Elby Showers, MD as PCP - General (Internal Medicine) Magrinat, Virgie Dad, MD (Inactive) as Consulting Physician (Oncology) Rolm Bookbinder, MD as Consulting Physician (General Surgery) Penni Bombard, MD as Consulting Physician (Neurology) Sylvan Cheese, NP as Nurse Practitioner (Hematology and Oncology)  Visit Date: 04/04/22  Subjective:    Patient ID: Cecelia Byars , Female   DOB: 1930-05-04, 89 y.o.    MRN: [id number]   89 y.o. Female presents today for vertigo. Patient has a past medical history of ductal carcinoma in situ left breast, cellulitis, hypertension, glaucoma, hypothyroidism, macular degeneration, peripheral neuropathy.  Patient was seen here on February 22 with vertigo and was referred to [facility] for evaluation.  She was prescribed meclizine 12.5 mg up to 3 times daily as needed for dizziness.  MRI of the brain showed no acute infarction hemorrhage or hydrocephalus.  Had thickening in right maxillary sinus.  Reports dizzy spells that began on 03/31/22 are still happening. Felt better on Saturday after 03/31/22 ED visit for vertigo, received Meclizine 12.5 mg every 6 hours as needed. Has been taking sparingly. Had dizzy spell and fall yesterday in kitchen while bending over. Reports feeling dizzy while walking but it was getting better this morning. Reports tingling in hands. Denies headache, nausea, vomiting.   Past Medical History:  Diagnosis Date   Breast cancer ([place])    2016 left    Difficult intubation    Ductal carcinoma in situ (DCIS) of left breast 07/2014   History of cellulitis    right leg - finished antibiotic 09/14/2014   Hypertension    states does not have HTN, has been on Losartan since 2007   Hypothyroidism    Macular degeneration    left eye   Peripheral neuropathy    lower legs     Family History  Problem Relation Age of Onset   Heart disease Mother    Stroke Mother    Hypertension  Mother    Alcohol abuse Father    Heart disease Brother    Melanoma Daughter     Social History   Social History Narrative   Patient lives at home alone.   Patient is right handed   Patient has a college education   Caffeine Use: 4 cups daily      Review of Systems  Constitutional:  Negative for fever and malaise/fatigue.  HENT:  Negative for congestion.   Eyes:  Negative for blurred vision.  Respiratory:  Negative for cough and shortness of breath.   Cardiovascular:  Negative for chest pain, palpitations and leg swelling.  Gastrointestinal:  Negative for vomiting.  Musculoskeletal:  Negative for back pain.  Skin:  Negative for rash.  Neurological:  Positive for dizziness and tingling (Bilateral hands). Negative for loss of consciousness and headaches.        Objective:   Vitals: BP 126/70   Pulse 66   Temp 97.9 F (36.6 C) (Tympanic)   Ht '5\' 3"'$  (1.6 m)   Wt 109 lb 6.4 oz (49.6 kg)   SpO2 98%   BMI 19.38 kg/m    Physical Exam Vitals and nursing note reviewed.  Constitutional:      General: She is not in acute distress.    Appearance: Normal appearance. She is not toxic-appearing.  HENT:     Head: Normocephalic and atraumatic.  Eyes:     Extraocular Movements:     Right eye: Nystagmus present.     Comments:  Two beats of rightward nystagmus visualized.  Pulmonary:     Effort: Pulmonary effort is normal.  Skin:    General: Skin is warm and dry.  Neurological:     Mental Status: She is alert and oriented to person, place, and time. Mental status is at baseline.     Comments: No tingling on hand elevation test, bilaterally. No facial weakness. Ambulatory.  Psychiatric:        Mood and Affect: Mood normal.        Behavior: Behavior normal.        Thought Content: Thought content normal.        Judgment: Judgment normal.     No carotid bruits heard  Results:   Studies obtained and personally reviewed by me:   Labs:       Component Value  Date/Time   NA 142 03/31/2022 1634   NA 143 09/02/2014 1556   K 3.8 03/31/2022 1634   K 4.1 09/02/2014 1556   CL 104 03/31/2022 1634   CO2 24 03/31/2022 1634   CO2 29 09/02/2014 1556   GLUCOSE 110 (H) 03/31/2022 1634   GLUCOSE 86 09/02/2014 1556   BUN 9 03/31/2022 1634   BUN 13.1 09/02/2014 1556   CREATININE 0.58 03/31/2022 1634   CREATININE 0.62 01/07/2022 0905   CREATININE 0.7 09/02/2014 1556   CALCIUM 10.3 03/31/2022 1634   CALCIUM 9.6 09/02/2014 1556   PROT 7.4 03/31/2022 1634   PROT 6.8 09/02/2014 1556   ALBUMIN 4.7 03/31/2022 1634   ALBUMIN 4.0 09/02/2014 1556   AST 20 03/31/2022 1634   AST 22 09/02/2014 1556   ALT 21 03/31/2022 1634   ALT 23 09/02/2014 1556   ALKPHOS 53 03/31/2022 1634   ALKPHOS 60 09/02/2014 1556   BILITOT 0.7 03/31/2022 1634   BILITOT 0.30 09/02/2014 1556   GFRNONAA >60 03/31/2022 1634   GFRNONAA 82 12/27/2019 1041   GFRAA 94 12/27/2019 1041     Lab Results  Component Value Date   WBC 6.6 03/31/2022   HGB 15.0 03/31/2022   HCT 43.4 03/31/2022   MCV 91.8 03/31/2022   PLT 203 03/31/2022    Lab Results  Component Value Date   CHOL 221 (H) 01/07/2022   HDL 81 01/07/2022   LDLCALC 120 (H) 01/07/2022   TRIG 94 01/07/2022   CHOLHDL 2.7 01/07/2022    No results found for: "HGBA1C"   Lab Results  Component Value Date   TSH 2.52 01/07/2022      Assessment & Plan:   Vertigo: Still having some vertigo symptoms. Take Meclizine 12.5 mg  two to  three times daily  for  vertigo. Ordered bilateral carotid US at pt. request.  Needs to be careful with position change.Consider neurology referral if symptoms persist.  Hypertension-stable on current regimen  Anxiety state  Hypothyroidism treated with thyroid replacement medication    I,Alexander Ruley,acting as a scribe for Elby Showers, MD.,have documented all relevant documentation on the behalf of Elby Showers, MD,as directed by  Elby Showers, MD while in the presence of Elby Showers,  MD.   I, Elby Showers, MD, have reviewed all documentation for this visit. The documentation on 04/04/22 for the exam, diagnosis, procedures, and orders are all accurate and complete.  Time spent with patient and her daughter today is 25 minutes

## 2022-04-04 NOTE — Patient Instructions (Addendum)
Patient is anxious about persistent vertigo.  She is to take meclizine 12.5 mg 2-3 times daily for vertigo.  She is concerned about possible carotid stenosis and bilateral carotid ultrasound ordered.  She will stay well-hydrated.  She will be careful with position change and continue to take meclizine.  Consider neurology referral if symptoms persist.

## 2022-04-11 ENCOUNTER — Ambulatory Visit (HOSPITAL_COMMUNITY)
Admission: RE | Admit: 2022-04-11 | Discharge: 2022-04-11 | Disposition: A | Discharge status: Home / Self Care | Payer: Medicare Other | Source: Ambulatory Visit | Attending: Internal Medicine | Admitting: Internal Medicine

## 2022-04-11 DIAGNOSIS — R42 Dizziness and giddiness: Secondary | ICD-10-CM | POA: Diagnosis not present

## 2022-04-11 NOTE — Progress Notes (Signed)
VASCULAR LAB    Carotid duplex has been performed.  See CV proc for preliminary results.   Cheyne Bungert, RVT 04/11/2022, 9:47 AM

## 2022-05-09 ENCOUNTER — Other Ambulatory Visit: Payer: Self-pay | Admitting: Internal Medicine

## 2022-08-04 ENCOUNTER — Encounter (INDEPENDENT_AMBULATORY_CARE_PROVIDER_SITE_OTHER): Payer: Medicare Other | Admitting: Ophthalmology

## 2022-08-04 DIAGNOSIS — H43811 Vitreous degeneration, right eye: Secondary | ICD-10-CM | POA: Diagnosis not present

## 2022-08-04 DIAGNOSIS — H35342 Macular cyst, hole, or pseudohole, left eye: Secondary | ICD-10-CM | POA: Diagnosis not present

## 2022-08-04 DIAGNOSIS — H353131 Nonexudative age-related macular degeneration, bilateral, early dry stage: Secondary | ICD-10-CM | POA: Diagnosis not present

## 2022-08-04 DIAGNOSIS — H401123 Primary open-angle glaucoma, left eye, severe stage: Secondary | ICD-10-CM | POA: Diagnosis not present

## 2022-08-05 ENCOUNTER — Other Ambulatory Visit: Payer: Self-pay | Admitting: Internal Medicine

## 2022-08-05 DIAGNOSIS — Z1231 Encounter for screening mammogram for malignant neoplasm of breast: Secondary | ICD-10-CM

## 2022-08-15 ENCOUNTER — Other Ambulatory Visit: Payer: Self-pay | Admitting: Internal Medicine

## 2022-09-19 ENCOUNTER — Ambulatory Visit: Payer: Medicare Other

## 2022-09-22 ENCOUNTER — Ambulatory Visit
Admission: RE | Admit: 2022-09-22 | Discharge: 2022-09-22 | Disposition: A | Discharge status: Home / Self Care | Payer: Medicare Other | Source: Ambulatory Visit | Attending: Internal Medicine | Admitting: Internal Medicine

## 2022-09-22 DIAGNOSIS — Z1231 Encounter for screening mammogram for malignant neoplasm of breast: Secondary | ICD-10-CM

## 2022-09-28 DIAGNOSIS — H40023 Open angle with borderline findings, high risk, bilateral: Secondary | ICD-10-CM | POA: Diagnosis not present

## 2022-09-28 DIAGNOSIS — H35342 Macular cyst, hole, or pseudohole, left eye: Secondary | ICD-10-CM | POA: Diagnosis not present

## 2022-09-28 DIAGNOSIS — Z961 Presence of intraocular lens: Secondary | ICD-10-CM | POA: Diagnosis not present

## 2022-11-11 ENCOUNTER — Other Ambulatory Visit: Payer: Self-pay | Admitting: Internal Medicine

## 2022-12-19 ENCOUNTER — Encounter: Payer: Self-pay | Admitting: Internal Medicine

## 2022-12-19 ENCOUNTER — Ambulatory Visit: Payer: Medicare Other | Admitting: Internal Medicine

## 2022-12-19 ENCOUNTER — Ambulatory Visit: Payer: Self-pay | Admitting: Internal Medicine

## 2022-12-19 VITALS — BP 120/70 | HR 70 | Ht 63.0 in | Wt 107.0 lb

## 2022-12-19 DIAGNOSIS — S91002A Unspecified open wound, left ankle, initial encounter: Secondary | ICD-10-CM

## 2022-12-19 DIAGNOSIS — Z23 Encounter for immunization: Secondary | ICD-10-CM | POA: Diagnosis not present

## 2022-12-19 DIAGNOSIS — S90512A Abrasion, left ankle, initial encounter: Secondary | ICD-10-CM

## 2022-12-19 MED ORDER — MUPIROCIN 2 % EX OINT: 1.0000 | TOPICAL_OINTMENT | Freq: Two times a day (BID) | CUTANEOUS | 0 refills | Status: DC

## 2022-12-19 MED ORDER — DOXYCYCLINE HYCLATE 100 MG PO TABS: 100.0000 mg | ORAL_TABLET | Freq: Two times a day (BID) | ORAL | 0 refills | Status: DC

## 2022-12-19 NOTE — Progress Notes (Shared)
Patient Care Team: Margaree Mackintosh, MD as PCP - General (Internal Medicine) Magrinat, Valentino Hue, MD (Inactive) as Consulting Physician (Oncology) Emelia Loron, MD as Consulting Physician (General Surgery) Suanne Marker, MD as Consulting Physician (Neurology) Salomon Fick, NP as Nurse Practitioner (Hematology and Oncology)  Visit Date: 12/19/22  Subjective:    Patient ID: Christine Frost , Female   DOB: 07-31-1930, 89 y.o.    MRN: [id number]   89 y.o. Female presents today for left lower leg abrasions that she received when doing yard work two weeks ago. The most painful abrasion is on her medial ankle. The abrasion and surrounding area are painful to the touch. She is covering with band-aid during the day. Pain extends upward to her left knee and is disrupting her sleep. Last tetanus given on 08/25/14.  Past Medical History:  Diagnosis Date  . Breast cancer ([facility])    2016 left   . Difficult intubation   . Ductal carcinoma in situ (DCIS) of left breast 07/2014  . History of cellulitis    right leg - finished antibiotic 09/14/2014  . Hypertension    states does not have HTN, has been on Losartan since 2007  . Hypothyroidism   . Macular degeneration    left eye  . Peripheral neuropathy    lower legs     Family History  Problem Relation Age of Onset  . Heart disease Mother   . Stroke Mother   . Hypertension Mother   . Alcohol abuse Father   . Heart disease Brother   . Melanoma Daughter     Social History   Social History Narrative   Patient lives at home alone.   Patient is right handed   Patient has a college education   Caffeine Use: 4 cups daily      Review of Systems  Constitutional:  Negative for fever and malaise/fatigue.  HENT:  Negative for congestion.   Eyes:  Negative for blurred vision.  Respiratory:  Negative for cough and shortness of breath.   Cardiovascular:  Negative for chest pain, palpitations and leg swelling.   Gastrointestinal:  Negative for vomiting.  Musculoskeletal:  Negative for back pain.  Skin:  Negative for rash.       (+) Abrasions left ankle, left posterior lower leg  Neurological:  Negative for loss of consciousness and headaches.        Objective:   Vitals: BP 120/70   Pulse 70   Ht 5\' 3"  (1.6 m)   Wt 107 lb (48.5 kg)   SpO2 98%   BMI 18.95 kg/m    Physical Exam Vitals and nursing note reviewed.  Constitutional:      General: She is not in acute distress.    Appearance: Normal appearance. She is not toxic-appearing.  HENT:     Head: Normocephalic and atraumatic.  Pulmonary:     Effort: Pulmonary effort is normal.  Skin:    General: Skin is warm and dry.     Comments: 1.5 cm abrasion with surrounding erythema left medial ankle.  Several small abrasions posterior left lower leg.  Neurological:     Mental Status: She is alert and oriented to person, place, and time. Mental status is at baseline.  Psychiatric:        Mood and Affect: Mood normal.        Behavior: Behavior normal.        Thought Content: Thought content normal.  Judgment: Judgment normal.      Results:   Studies obtained and personally reviewed by me:   Labs:       Component Value Date/Time   NA 142 03/31/2022 1634   NA 143 09/02/2014 1556   K 3.8 03/31/2022 1634   K 4.1 09/02/2014 1556   CL 104 03/31/2022 1634   CO2 24 03/31/2022 1634   CO2 29 09/02/2014 1556   GLUCOSE 110 (H) 03/31/2022 1634   GLUCOSE 86 09/02/2014 1556   BUN 9 03/31/2022 1634   BUN 13.1 09/02/2014 1556   CREATININE 0.58 03/31/2022 1634   CREATININE 0.62 01/07/2022 0905   CREATININE 0.7 09/02/2014 1556   CALCIUM 10.3 03/31/2022 1634   CALCIUM 9.6 09/02/2014 1556   PROT 7.4 03/31/2022 1634   PROT 6.8 09/02/2014 1556   ALBUMIN 4.7 03/31/2022 1634   ALBUMIN 4.0 09/02/2014 1556   AST 20 03/31/2022 1634   AST 22 09/02/2014 1556   ALT 21 03/31/2022 1634   ALT 23 09/02/2014 1556   ALKPHOS 53 03/31/2022  1634   ALKPHOS 60 09/02/2014 1556   BILITOT 0.7 03/31/2022 1634   BILITOT 0.30 09/02/2014 1556   GFRNONAA >60 03/31/2022 1634   GFRNONAA 82 12/27/2019 1041   GFRAA 94 12/27/2019 1041     Lab Results  Component Value Date   WBC 6.6 03/31/2022   HGB 15.0 03/31/2022   HCT 43.4 03/31/2022   MCV 91.8 03/31/2022   PLT 203 03/31/2022    Lab Results  Component Value Date   CHOL 221 (H) 01/07/2022   HDL 81 01/07/2022   LDLCALC 120 (H) 01/07/2022   TRIG 94 01/07/2022   CHOLHDL 2.7 01/07/2022    No results found for: "HGBA1C"   Lab Results  Component Value Date   TSH 2.52 01/07/2022      Assessment & Plan:   Abrasions left medial ankle, posterior left lower leg: prescribed doxycycline 100 mg twice daily, mupirocin 2% ointment. CMA cleaned wound with peroxide, applied mupirocin and covered with Telfa today. Advised to clean with peroxide, apply mupirocin and cover with dressing twice daily. Administered tetanus booster.  Return in December for physical or sooner if needed.    I,Alexander Ruley,acting as a Neurosurgeon for Margaree Mackintosh, MD.,have documented all relevant documentation on the behalf of Margaree Mackintosh, MD,as directed by  Margaree Mackintosh, MD while in the presence of Margaree Mackintosh, MD.   ***

## 2022-12-19 NOTE — Telephone Encounter (Signed)
Copied from CRM [id number]. Topic: Clinical - Red Word Triage >> Dec 19, 2022  8:32 AM Christine Frost wrote: Red Word that prompted transfer to Nurse Triage: Pt is saying she believes she has cellulitis on her left leg from a scrape she got 3 weeks ago working in her yard and it is now hard to sleep at night with the pain. Reason for Disposition . [1] MODERATE leg swelling (e.g., swelling extends up to knees) AND [2] new-onset or worsening  Answer Assessment - Initial Assessment Questions 1. ONSET: "When did the swelling start?"  3 weeks ago 2. LOCATION: "What part of the leg is swollen?"  "Are both legs swollen or just one leg?" Only Left leg 3. SEVERITY: "How bad is the swelling?"Moderate  swollen from knee down      4. REDNESS: "Does the swelling look red or infected?"     yes 5. PAIN: "Is the swelling painful to touch?" If Yes, ask: "How painful is it?"   Yes  very painful when sleeping      6. FEVER: "Do you have a fever?" Not sure , it doesn't feel like it  7. CAUSE: "What do you think is causing the leg swelling?" Scraped my leg while working int he garden  Protocols used: Leg Swelling and Edema-A-AH

## 2022-12-19 NOTE — Telephone Encounter (Signed)
This is not this office patient it has been routed to the wrong office.

## 2022-12-19 NOTE — Telephone Encounter (Signed)
This encounter was created in error - please disregard.

## 2022-12-22 NOTE — Patient Instructions (Addendum)
Patient has a secondarily infected abrasion left medial ankle and several other abrasion posterior left lower leg that are traumatic from doing yard work.  Advised to clean with peroxide twice daily and apply mupirocin ointment twice daily.  Tetanus update given.  Take doxycycline 100 mg twice daily for 7 days

## 2022-12-27 DIAGNOSIS — H40023 Open angle with borderline findings, high risk, bilateral: Secondary | ICD-10-CM | POA: Diagnosis not present

## 2022-12-27 DIAGNOSIS — H35342 Macular cyst, hole, or pseudohole, left eye: Secondary | ICD-10-CM | POA: Diagnosis not present

## 2022-12-27 DIAGNOSIS — Z961 Presence of intraocular lens: Secondary | ICD-10-CM | POA: Diagnosis not present
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
## 2023-01-10 ENCOUNTER — Other Ambulatory Visit: Payer: Medicare Other

## 2023-01-10 DIAGNOSIS — I10 Essential (primary) hypertension: Secondary | ICD-10-CM | POA: Diagnosis not present

## 2023-01-10 DIAGNOSIS — E039 Hypothyroidism, unspecified: Secondary | ICD-10-CM | POA: Diagnosis not present

## 2023-01-10 DIAGNOSIS — G609 Hereditary and idiopathic neuropathy, unspecified: Secondary | ICD-10-CM

## 2023-01-10 DIAGNOSIS — E785 Hyperlipidemia, unspecified: Secondary | ICD-10-CM

## 2023-01-10 DIAGNOSIS — Z0000 Encounter for general adult medical examination without abnormal findings: Secondary | ICD-10-CM | POA: Diagnosis not present

## 2023-01-11 LAB — CBC WITH DIFFERENTIAL/PLATELET
Absolute Lymphocytes: 1358 {cells}/uL (ref 850–3900)
Absolute Monocytes: 546 {cells}/uL (ref 200–950)
Basophils Absolute: 99 {cells}/uL (ref 0–200)
Basophils Relative: 1.6 %
Eosinophils Absolute: 229 {cells}/uL (ref 15–500)
Eosinophils Relative: 3.7 %
HCT: 41.6 % (ref 35.0–45.0)
Hemoglobin: 13.9 g/dL (ref 11.7–15.5)
MCH: 31.2 pg (ref 27.0–33.0)
MCHC: 33.4 g/dL (ref 32.0–36.0)
MCV: 93.5 fL (ref 80.0–100.0)
MPV: 9.5 fL (ref 7.5–12.5)
Monocytes Relative: 8.8 %
Neutro Abs: 3968 {cells}/uL (ref 1500–7800)
Neutrophils Relative %: 64 %
Platelets: 233 10*3/uL (ref 140–400)
RBC: 4.45 10*6/uL (ref 3.80–5.10)
RDW: 13.2 % (ref 11.0–15.0)
Total Lymphocyte: 21.9 %
WBC: 6.2 10*3/uL (ref 3.8–10.8)

## 2023-01-11 LAB — COMPLETE METABOLIC PANEL WITH GFR
AG Ratio: 1.8 (calc) (ref 1.0–2.5)
ALT: 23 U/L (ref 6–29)
AST: 27 U/L (ref 10–35)
Albumin: 4.3 g/dL (ref 3.6–5.1)
Alkaline phosphatase (APISO): 62 U/L (ref 37–153)
BUN/Creatinine Ratio: 20 (calc) (ref 6–22)
BUN: 12 mg/dL (ref 7–25)
CO2: 30 mmol/L (ref 20–32)
Calcium: 9.9 mg/dL (ref 8.6–10.4)
Chloride: 105 mmol/L (ref 98–110)
Creat: 0.59 mg/dL — ABNORMAL LOW (ref 0.60–0.95)
Globulin: 2.4 g/dL (ref 1.9–3.7)
Glucose, Bld: 85 mg/dL (ref 65–99)
Potassium: 4.4 mmol/L (ref 3.5–5.3)
Sodium: 143 mmol/L (ref 135–146)
Total Bilirubin: 0.5 mg/dL (ref 0.2–1.2)
Total Protein: 6.7 g/dL (ref 6.1–8.1)
eGFR: 86 mL/min/{1.73_m2} (ref >=60)

## 2023-01-11 LAB — LIPID PANEL
Cholesterol: 219 mg/dL — ABNORMAL HIGH (ref <200)
HDL: 80 mg/dL (ref >=50)
LDL Cholesterol (Calc): 119 mg/dL — ABNORMAL HIGH
Non-HDL Cholesterol (Calc): 139 mg/dL — ABNORMAL HIGH (ref <130)
Total CHOL/HDL Ratio: 2.7 (calc) (ref <5.0)
Triglycerides: 97 mg/dL (ref <150)

## 2023-01-11 LAB — TSH: TSH: 2.3 m[IU]/L (ref 0.40–4.50)

## 2023-01-12 ENCOUNTER — Encounter: Payer: Self-pay | Admitting: Internal Medicine

## 2023-01-12 ENCOUNTER — Ambulatory Visit: Payer: Medicare Other | Admitting: Internal Medicine

## 2023-01-12 VITALS — BP 110/60 | HR 79 | Ht 63.0 in | Wt 108.0 lb

## 2023-01-12 DIAGNOSIS — I10 Essential (primary) hypertension: Secondary | ICD-10-CM

## 2023-01-12 DIAGNOSIS — Z86000 Personal history of in-situ neoplasm of breast: Secondary | ICD-10-CM | POA: Diagnosis not present

## 2023-01-12 DIAGNOSIS — H3530 Unspecified macular degeneration: Secondary | ICD-10-CM | POA: Diagnosis not present

## 2023-01-12 DIAGNOSIS — H401130 Primary open-angle glaucoma, bilateral, stage unspecified: Secondary | ICD-10-CM

## 2023-01-12 DIAGNOSIS — R42 Dizziness and giddiness: Secondary | ICD-10-CM

## 2023-01-12 DIAGNOSIS — G609 Hereditary and idiopathic neuropathy, unspecified: Secondary | ICD-10-CM

## 2023-01-12 DIAGNOSIS — S90512A Abrasion, left ankle, initial encounter: Secondary | ICD-10-CM | POA: Diagnosis not present

## 2023-01-12 DIAGNOSIS — E039 Hypothyroidism, unspecified: Secondary | ICD-10-CM | POA: Diagnosis not present

## 2023-01-12 DIAGNOSIS — Z0000 Encounter for general adult medical examination without abnormal findings: Secondary | ICD-10-CM

## 2023-01-12 DIAGNOSIS — E7800 Pure hypercholesterolemia, unspecified: Secondary | ICD-10-CM | POA: Diagnosis not present

## 2023-01-12 LAB — POCT URINALYSIS DIP (CLINITEK)
Bilirubin, UA: NEGATIVE
Blood, UA: NEGATIVE
Glucose, UA: NEGATIVE mg/dL
Ketones, POC UA: NEGATIVE mg/dL
Leukocytes, UA: NEGATIVE
Nitrite, UA: NEGATIVE
POC PROTEIN,UA: NEGATIVE
Spec Grav, UA: 1.015 (ref 1.010–1.025)
Urobilinogen, UA: 0.2 U/dL
pH, UA: 6.5 (ref 5.0–8.0)

## 2023-02-01 NOTE — Patient Instructions (Addendum)
It was a pleasure to see you today. Pneumococcal vaccine declined by patient. Continue same meds and return in one year or as needed.

## 2023-02-21 DIAGNOSIS — H02883 Meibomian gland dysfunction of right eye, unspecified eyelid: Secondary | ICD-10-CM | POA: Diagnosis not present

## 2023-02-21 DIAGNOSIS — H04123 Dry eye syndrome of bilateral lacrimal glands: Secondary | ICD-10-CM | POA: Diagnosis not present

## 2023-02-21 DIAGNOSIS — H02886 Meibomian gland dysfunction of left eye, unspecified eyelid: Secondary | ICD-10-CM | POA: Diagnosis not present

## 2023-02-21 DIAGNOSIS — H5711 Ocular pain, right eye: Secondary | ICD-10-CM | POA: Diagnosis not present

## 2023-04-04 DIAGNOSIS — H5711 Ocular pain, right eye: Secondary | ICD-10-CM | POA: Diagnosis not present

## 2023-04-04 DIAGNOSIS — H04123 Dry eye syndrome of bilateral lacrimal glands: Secondary | ICD-10-CM | POA: Diagnosis not present

## 2023-04-04 DIAGNOSIS — H02886 Meibomian gland dysfunction of left eye, unspecified eyelid: Secondary | ICD-10-CM | POA: Diagnosis not present

## 2023-04-04 DIAGNOSIS — H02883 Meibomian gland dysfunction of right eye, unspecified eyelid: Secondary | ICD-10-CM | POA: Diagnosis not present

## 2023-05-16 ENCOUNTER — Other Ambulatory Visit: Payer: Self-pay

## 2023-05-16 ENCOUNTER — Other Ambulatory Visit: Payer: Self-pay | Admitting: Internal Medicine

## 2023-05-16 DIAGNOSIS — E039 Hypothyroidism, unspecified: Secondary | ICD-10-CM

## 2023-05-16 MED ORDER — SYNTHROID 50 MCG PO TABS: ORAL_TABLET | ORAL | 1 refills | Status: DC

## 2023-06-22 ENCOUNTER — Ambulatory Visit: Payer: Self-pay

## 2023-06-22 NOTE — Progress Notes (Signed)
 Patient Care Team: Sylvan Evener, MD as PCP - General (Internal Medicine) Magrinat, Rozella Cornfield, MD (Inactive) as Consulting Physician (Oncology) Enid Harry, MD as Consulting Physician (General Surgery) Omega Bible, MD as Consulting Physician (Neurology) Dama Duffel, NP as Nurse Practitioner (Hematology and Oncology)  Visit Date: 06/23/23  Subjective:   Chief Complaint  Patient presents with   Leg Swelling    Christine Frost three weeks ago,she tripped with sleepers. Right leg swelling, wound would not heal.   Patient UJ:Christine Frost,Female DOB:1930-07-22,89 y.o. NFA:[id number]   89 y.o.Female presents today for acute visit with Fall; Unhealing Wound; Right Leg Swelling. Patient has a past medical history of Cellulitis 2016. Says that 3 weeks ago she fell and scraped her right leg - has been applying Neosporin and cleaning the wound with Vashe Wound Solution, but this has not done much. Denies fever/chills or pain. Mentions that she will be going out of town from 5/22 - 5/26.   Past Medical History:  Diagnosis Date   Breast cancer (HCC)    2016 left    Difficult intubation    Ductal carcinoma in situ (DCIS) of left breast 07/2014   History of cellulitis    right leg - finished antibiotic 09/14/2014   Hypertension    states does not have HTN, has been on Losartan  since 2007   Hypothyroidism    Macular degeneration    left eye   Peripheral neuropathy    lower legs    Allergies  Allergen Reactions   Penicillins Rash   Pred Forte [Prednisolone Acetate] Other (See Comments)    OPHTHALMIC - EXCESSIVE TEARING OF EYES    Family History  Problem Relation Age of Onset   Heart disease Mother    Stroke Mother    Hypertension Mother    Alcohol abuse Father    Heart disease Brother    Melanoma Daughter    Social Hx: 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 2 daughters. One daughter is a Garment/textile technologist at [employer]. Another daughter  lives in [place]. Widow. Resides alone. Continues to drive and do some yard work.  Review of Systems  Constitutional:  Negative for chills and fever.       (-) Pain  Musculoskeletal:  Positive for falls (3 weeks ago).  Skin:        (+) Abrasion, Right Leg w/ surrounding redness and swelling (+) Slow Wound Healing   All other systems reviewed and are negative.    Objective:  Vitals: BP 110/70   Pulse 67   Ht 5\' 3"  (1.6 m)   Wt 107 lb (48.5 kg)   SpO2 97%   BMI 18.95 kg/m   Physical Exam Vitals and nursing note reviewed.  Constitutional:      General: She is not in acute distress.    Appearance: Normal appearance. She is not toxic-appearing.  HENT:     Head: Normocephalic and atraumatic.  Pulmonary:     Effort: Pulmonary effort is normal.  Skin:    General: Skin is warm and dry.     Findings: Abrasion, erythema and signs of injury present.          Comments: Abrasion, right leg inferior to the knee along the anterior tibia. Some surrounding erythema.   Neurological:     Mental Status: She is alert and oriented to person, place, and time. Mental status is at baseline.  Psychiatric:        Mood and Affect:  Mood normal.        Behavior: Behavior normal.        Thought Content: Thought content normal.        Judgment: Judgment normal.     Results:  Studies Obtained And Personally Reviewed By Me: Labs:     Component Value Date/Time   NA 143 01/10/2023 0949   NA 143 09/02/2014 1556   K 4.4 01/10/2023 0949   K 4.1 09/02/2014 1556   CL 105 01/10/2023 0949   CO2 30 01/10/2023 0949   CO2 29 09/02/2014 1556   GLUCOSE 85 01/10/2023 0949   GLUCOSE 86 09/02/2014 1556   BUN 12 01/10/2023 0949   BUN 13.1 09/02/2014 1556   CREATININE 0.59 (L) 01/10/2023 0949   CREATININE 0.7 09/02/2014 1556   CALCIUM 9.9 01/10/2023 0949   CALCIUM 9.6 09/02/2014 1556   PROT 6.7 01/10/2023 0949   PROT 6.8 09/02/2014 1556   ALBUMIN 4.7 03/31/2022 1634   ALBUMIN 4.0 09/02/2014 1556    AST 27 01/10/2023 0949   AST 22 09/02/2014 1556   ALT 23 01/10/2023 0949   ALT 23 09/02/2014 1556   ALKPHOS 53 03/31/2022 1634   ALKPHOS 60 09/02/2014 1556   BILITOT 0.5 01/10/2023 0949   BILITOT 0.30 09/02/2014 1556   GFRNONAA >60 03/31/2022 1634   GFRNONAA 82 12/27/2019 1041   GFRAA 94 12/27/2019 1041    Lab Results  Component Value Date   WBC 6.2 01/10/2023   HGB 13.9 01/10/2023   HCT 41.6 01/10/2023   MCV 93.5 01/10/2023   PLT 233 01/10/2023   Lab Results  Component Value Date   CHOL 219 (H) 01/10/2023   HDL 80 01/10/2023   LDLCALC 119 (H) 01/10/2023   TRIG 97 01/10/2023   CHOLHDL 2.7 01/10/2023   Lab Results  Component Value Date   TSH 2.30 01/10/2023   Assessment & Plan:   Abrasion, Right Leg: 3 weeks ago she fell and scraped her right leg - has been applying Neosporin and cleaning the wound with Vashe Wound Solution, but this has not done much. Denies fever/chills or pain. Mentions that she will be going out of town from 5/22 - 5/26. Provided with telfa dressings and paper tape for wound maintenance. Sending in Mupirocin  2% topical ointment to apply twice daily and Doxycycline  100 mg to take twice daily x7 days. Clean with warm soapy water, then apply peroxide followed by mupirocin  twice a day. Cover with Telfa and paper tape. Suggest consultation with [facility]. Tdap is up to date from 2024.  Return in 13 days (on 07/06/2023) for Wound recheck (Abrasion, Right Leg).    I,Emily Lagle,acting as a Neurosurgeon for Sylvan Evener, MD.,have documented all relevant documentation on the behalf of Sylvan Evener, MD,as directed by  Sylvan Evener, MD while in the presence of Sylvan Evener, MD.   I, Sylvan Evener, MD, have reviewed all documentation for this visit. The documentation on 06/23/23 for the exam, diagnosis, procedures, and orders are all accurate and complete.

## 2023-06-22 NOTE — Telephone Encounter (Signed)
  Chief Complaint: leg swelling/laceration to leg Symptoms: moderate right leg swelling from ankle to three inches below knee. Reports redness. Laceration to front of right leg from fall three weeks ago-slow to heal. Frequency: three weeks ago Pertinent Negatives: Patient denies fever Disposition: [] ED /[] Urgent Care (no appt availability in office) / [x] Appointment(In office/virtual)/ []  [facility]/ [] Home Care/ [] Refused Recommended Disposition /[] [facility]/ []  Follow-up with PCP Additional Notes: patient calling with concerns for swelling to her right leg along with a laceration that is slow to heal. Patient states she fell a few weeks ago and had a laceration to the front of her right leg. Patient states she developed some swelling to the area a few days after the fall. Patient states the leg is swollen from her ankle to about three inches below her knee. Reports redness and concerned for possible cellulitis-no fever. Per protocol, patient is recommended to be seen in 24 hours. Appointment made for tomorrow 06/23/2023 at 9:30 AM. Patient verbalized understanding and all questions answered.    Copied from CRM [id number]. Topic: Clinical - Red Word Triage >> Jun 22, 2023 11:46 AM Elle L wrote: Red Word that prompted transfer to Nurse Triage: The patient states she fell a few weeks ago and has a laceration that is slow to heal on her leg, as well as swelling in her leg. Reason for Disposition  [1] MODERATE leg swelling (e.g., swelling extends up to knees) AND [2] new-onset or worsening  Answer Assessment - Initial Assessment Questions 1. ONSET: "When did the swelling start?" (e.g., minutes, hours, days)     Started a couple of days after she fell a few weeks ago 2. LOCATION: "What part of the leg is swollen?"  "Are both legs swollen or just one leg?"     Right lower leg 3. SEVERITY: "How bad is the swelling?" (e.g., localized; mild, moderate, severe)   - Localized: Small  area of swelling localized to one leg.   - MILD pedal edema: Swelling limited to foot and ankle, pitting edema < 1/4 inch (6 mm) deep, rest and elevation eliminate most or all swelling.   - MODERATE edema: Swelling of lower leg to knee, pitting edema > 1/4 inch (6 mm) deep, rest and elevation only partially reduce swelling.   - SEVERE edema: Swelling extends above knee, facial or hand swelling present.      Mild to moderate 4. REDNESS: "Does the swelling look red or infected?"     Redness around the area 5. PAIN: "Is the swelling painful to touch?" If Yes, ask: "How painful is it?"   (Scale 1-10; mild, moderate or severe)     no 6. FEVER: "Do you have a fever?" If Yes, ask: "What is it, how was it measured, and when did it start?"      no 7. CAUSE: "What do you think is causing the leg swelling?"     Patient questioning if she has cellulitis 8. MEDICAL HISTORY: "Do you have a history of blood clots (e.g., DVT), cancer, heart failure, kidney disease, or liver failure?"     no 9. RECURRENT SYMPTOM: "Have you had leg swelling before?" If Yes, ask: "When was the last time?" "What happened that time?"     no 10. OTHER SYMPTOMS: "Do you have any other symptoms?" (e.g., chest pain, difficulty breathing)       no  Protocols used: Leg Swelling and Edema-A-AH

## 2023-06-23 ENCOUNTER — Ambulatory Visit (INDEPENDENT_AMBULATORY_CARE_PROVIDER_SITE_OTHER): Admitting: Internal Medicine

## 2023-06-23 ENCOUNTER — Encounter: Payer: Self-pay | Admitting: Internal Medicine

## 2023-06-23 VITALS — BP 110/70 | HR 67 | Ht 63.0 in | Wt 107.0 lb

## 2023-06-23 DIAGNOSIS — W19XXXA Unspecified fall, initial encounter: Secondary | ICD-10-CM | POA: Diagnosis not present

## 2023-06-23 DIAGNOSIS — S80811A Abrasion, right lower leg, initial encounter: Secondary | ICD-10-CM

## 2023-06-23 MED ORDER — DOXYCYCLINE HYCLATE 100 MG PO TABS: 100.0000 mg | ORAL_TABLET | Freq: Two times a day (BID) | ORAL | 0 refills | Status: DC

## 2023-06-23 MED ORDER — MUPIROCIN 2 % EX OINT: 1.0000 | TOPICAL_OINTMENT | Freq: Two times a day (BID) | CUTANEOUS | 0 refills | Status: DC

## 2023-06-23 NOTE — Patient Instructions (Addendum)
 We have cleaned and dressed your wound for today. Tetanus vaccine is up to date. Please clean wound with warm soapy water dry carefully and apply small ampont of Mupirocin  antibiotic ointment once daily Take Doxycyline for 7 days. We are making referral to [facility].Return May 29 here if not being seen at Wound Care by then.

## 2023-06-26 DIAGNOSIS — H40023 Open angle with borderline findings, high risk, bilateral: Secondary | ICD-10-CM | POA: Diagnosis not present

## 2023-06-26 DIAGNOSIS — H02886 Meibomian gland dysfunction of left eye, unspecified eyelid: Secondary | ICD-10-CM | POA: Diagnosis not present

## 2023-06-26 DIAGNOSIS — H04123 Dry eye syndrome of bilateral lacrimal glands: Secondary | ICD-10-CM | POA: Diagnosis not present

## 2023-06-26 DIAGNOSIS — H02883 Meibomian gland dysfunction of right eye, unspecified eyelid: Secondary | ICD-10-CM | POA: Diagnosis not present

## 2023-07-04 NOTE — Progress Notes (Signed)
 Patient Care Team: Sylvan Evener, MD as PCP - General (Internal Medicine) Magrinat, Rozella Cornfield, MD (Inactive) as Consulting Physician (Oncology) Enid Harry, MD as Consulting Physician (General Surgery) Omega Bible, MD as Consulting Physician (Neurology) Dama Duffel, NP as Nurse Practitioner (Hematology and Oncology)  Visit Date: 07/06/23  Subjective:   Chief Complaint  Patient presents with   Abrasion  Patient AV:[id number] Christine Frost,Female DOB:02-22-1930,89 y.o. NWG:[id number]   89 y.o.Female presents today for 2 week follow-up for Abrasion, Right Leg. Patient has a past medical history of Cellulitis 2016. Seen 5/16 after having fallen 3 weeks prior abrading her right leg along the shin with slowed healing despite applying Neosporin and Vashe Wound Solution. Prescribed Mupirocin  topical ointment and Doxycycline  100 mg twice daily x7 days, and was provided with Telfa dressing and paper tape. Says that her wound healed quicker once she had finished her Doxycycline  and the swelling has started to reduce; has been changing the bandage twice daily and applying Mupirocin  with every change. Denies pain or fever anytime throughout her healing process. Discussed evaluation for clotting, which she declines at this time but will contact if anything regarding her wound changes.   Past Medical History:  Diagnosis Date   Breast cancer (HCC)    2016 left    Difficult intubation    Ductal carcinoma in situ (DCIS) of left breast 07/2014   History of cellulitis    right leg - finished antibiotic 09/14/2014   Hypertension    states does not have HTN, has been on Losartan  since 2007   Hypothyroidism    Macular degeneration    left eye   Peripheral neuropathy    lower legs    Allergies  Allergen Reactions   Penicillins Rash   Pred Forte [Prednisolone Acetate] Other (See Comments)    OPHTHALMIC - EXCESSIVE TEARING OF EYES    Family History  Problem Relation Age of Onset    Heart disease Mother    Stroke Mother    Hypertension Mother    Alcohol abuse Father    Heart disease Brother    Melanoma Daughter    Social History   Social History Narrative   Patient lives at home alone.   Patient is right handed   Patient has a college education   Caffeine Use: 4 cups daily   Review of Systems  Constitutional:  Negative for fever.  Skin:        (+) Healing Abrasion, Right Lower Leg (-) Pain   All other systems reviewed and are negative.    Objective:  Vitals: BP 110/80   Pulse 66   Ht 5\' 3"  (1.6 m)   Wt 107 lb (48.5 kg)   SpO2 98%   BMI 18.95 kg/m   Physical Exam Skin:         Comments: Abrasion, right leg inferior to knee along anterior tibia, healing well.  Erythema surrounding upper portion where abrasion was worse.  Some swelling still remains, but reduced from initial evaluation.      Results:  Studies Obtained And Personally Reviewed By Me: Labs:     Component Value Date/Time   NA 143 01/10/2023 0949   NA 143 09/02/2014 1556   K 4.4 01/10/2023 0949   K 4.1 09/02/2014 1556   CL 105 01/10/2023 0949   CO2 30 01/10/2023 0949   CO2 29 09/02/2014 1556   GLUCOSE 85 01/10/2023 0949   GLUCOSE 86 09/02/2014 1556   BUN 12 01/10/2023 0949  BUN 13.1 09/02/2014 1556   CREATININE 0.59 (L) 01/10/2023 0949   CREATININE 0.7 09/02/2014 1556   CALCIUM 9.9 01/10/2023 0949   CALCIUM 9.6 09/02/2014 1556   PROT 6.7 01/10/2023 0949   PROT 6.8 09/02/2014 1556   ALBUMIN 4.7 03/31/2022 1634   ALBUMIN 4.0 09/02/2014 1556   AST 27 01/10/2023 0949   AST 22 09/02/2014 1556   ALT 23 01/10/2023 0949   ALT 23 09/02/2014 1556   ALKPHOS 53 03/31/2022 1634   ALKPHOS 60 09/02/2014 1556   BILITOT 0.5 01/10/2023 0949   BILITOT 0.30 09/02/2014 1556   GFRNONAA >60 03/31/2022 1634   GFRNONAA 82 12/27/2019 1041   GFRAA 94 12/27/2019 1041    Lab Results  Component Value Date   WBC 6.2 01/10/2023   HGB 13.9 01/10/2023   HCT 41.6 01/10/2023   MCV 93.5  01/10/2023   PLT 233 01/10/2023   Lab Results  Component Value Date   CHOL 219 (H) 01/10/2023   HDL 80 01/10/2023   LDLCALC 119 (H) 01/10/2023   TRIG 97 01/10/2023   CHOLHDL 2.7 01/10/2023   Lab Results  Component Value Date   TSH 2.30 01/10/2023   Assessment & Plan:   Subsequent Encounter for Abrasion, Right Leg: past medical history of Cellulitis 2016. Seen 5/16 after having fallen 3 weeks prior abrading her right leg along the shin with slowed healing despite applying Neosporin and Vashe Wound Solution. Prescribed Mupirocin  topical ointment and Doxycycline  100 mg twice daily x7 days, and was provided with Telfa dressing and paper tape. Wound has healed well, though apparently healed faster following completion of Doxycycline  and surrounding swelling has started to reduce, especially compared to initial visit. She has been changing the bandage twice daily and applying Mupirocin  with every change. Denies pain or fever anytime throughout her healing process. Discussed evaluation for clotting, which she declines at this time but will contact if anything regarding her wound changes. Instructed her to otherwise contact office in one month for an update on how the wound is healing.     I,Emily Lagle,acting as a Neurosurgeon for Sylvan Evener, MD.,have documented all relevant documentation on the behalf of Sylvan Evener, MD,as directed by  Sylvan Evener, MD while in the presence of Sylvan Evener, MD.   I, Sylvan Evener, MD, have reviewed all documentation for this visit. The documentation on 07/07/23 for the exam, diagnosis, procedures, and orders are all accurate and complete.

## 2023-07-06 ENCOUNTER — Ambulatory Visit: Admitting: Internal Medicine

## 2023-07-06 ENCOUNTER — Encounter: Payer: Self-pay | Admitting: Internal Medicine

## 2023-07-06 VITALS — BP 110/80 | HR 66 | Ht 63.0 in | Wt 107.0 lb

## 2023-07-06 DIAGNOSIS — S80811A Abrasion, right lower leg, initial encounter: Secondary | ICD-10-CM

## 2023-07-06 DIAGNOSIS — S80811D Abrasion, right lower leg, subsequent encounter: Secondary | ICD-10-CM | POA: Diagnosis not present

## 2023-07-07 NOTE — Patient Instructions (Signed)
 I think this abrasion is nearly healed and will continue to do well. She will let me know if she has any concerns or questions.

## 2023-08-03 DIAGNOSIS — H43811 Vitreous degeneration, right eye: Secondary | ICD-10-CM | POA: Diagnosis not present

## 2023-08-03 DIAGNOSIS — H401123 Primary open-angle glaucoma, left eye, severe stage: Secondary | ICD-10-CM | POA: Diagnosis not present

## 2023-08-03 DIAGNOSIS — H35342 Macular cyst, hole, or pseudohole, left eye: Secondary | ICD-10-CM | POA: Diagnosis not present

## 2023-08-03 DIAGNOSIS — H401112 Primary open-angle glaucoma, right eye, moderate stage: Secondary | ICD-10-CM | POA: Diagnosis not present

## 2023-08-03 DIAGNOSIS — H353131 Nonexudative age-related macular degeneration, bilateral, early dry stage: Secondary | ICD-10-CM | POA: Diagnosis not present

## 2023-08-04 ENCOUNTER — Ambulatory Visit: Admitting: Internal Medicine

## 2023-08-04 ENCOUNTER — Encounter: Payer: Self-pay | Admitting: Internal Medicine

## 2023-08-04 VITALS — BP 168/88 | HR 74 | Temp 98.6°F | Ht 63.0 in | Wt 105.8 lb

## 2023-08-04 DIAGNOSIS — S80812D Abrasion, left lower leg, subsequent encounter: Secondary | ICD-10-CM

## 2023-08-04 DIAGNOSIS — I10 Essential (primary) hypertension: Secondary | ICD-10-CM

## 2023-08-04 DIAGNOSIS — I82461 Acute embolism and thrombosis of right calf muscular vein: Secondary | ICD-10-CM

## 2023-08-04 NOTE — Progress Notes (Addendum)
 Patient Care Team: Perri Ronal PARAS, MD as PCP - General (Internal Medicine) Magrinat, Sandria BROCKS, MD (Inactive) as Consulting Physician (Oncology) Ebbie Cough, MD as Consulting Physician (General Surgery) Margaret Eduard SAUNDERS, MD as Consulting Physician (Neurology) Moses Powell Hummer, NP as Nurse Practitioner (Hematology and Oncology)  Visit Date: 08/04/23  Subjective:   Chief Complaint  Patient presents with   Leg Swelling    Nothing helps it not swell. Only goes down at night when sleeping.    Vitals:   08/04/23 1439  BP: (!) 168/88   Patient PI:Christine Frost,Female DOB:1930/03/28,89 y.o. FMW:[id number]   89 y.o.Female presents today for 1 month follow-up for Abrasion, Right Lower Leg. Patient has a past medical history of Cellulitis 2016. Initially seen for this problem on 5/16, subsequent visit on 5/29 with a slow-healing abrasion on her left lower leg. She was prescribed Mupirocin  topical ointment and Doxycycline  100 mg twice daily x7 days with provisions of Telfa dressing and paper tape, which she had completed the course of antibiotics by the 29th. Today, says that wound has still not completely healed and swelling has persisted, though will improve at night.   Past Medical History:  Diagnosis Date   Breast cancer (HCC)    2016 left    Difficult intubation    Ductal carcinoma in situ (DCIS) of left breast 07/2014   History of cellulitis    right leg - finished antibiotic 09/14/2014   Hypertension    states does not have HTN, has been on Losartan  since 2007   Hypothyroidism    Macular degeneration    left eye   Peripheral neuropathy    lower legs    Allergies  Allergen Reactions   Penicillins Rash   Pred Forte [Prednisolone Acetate] Other (See Comments)    OPHTHALMIC - EXCESSIVE TEARING OF EYES    Family History  Problem Relation Age of Onset   Heart disease Mother    Stroke Mother    Hypertension Mother    Alcohol abuse Father    Heart disease  Brother    Melanoma Daughter    Social History   Social History Narrative   Patient lives at home alone.   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 She is a widow and resides alone. 2 adult daughters. One daughter lives here and is a Garment/textile technologist at [employer].  Review of Systems  Cardiovascular:  Positive for leg swelling.  All other systems reviewed and are negative.    Objective:  Vitals: BP (!) 168/88   Pulse 74   Temp 98.6 F (37 C) (Temporal)   Ht 5' 3 (1.6 m)   Wt 105 lb 12.8 oz (48 kg)   PF 98 L/min   BMI 18.74 kg/m   Physical Exam Vitals and nursing note reviewed.  Constitutional:      General: She is not in acute distress.    Appearance: Normal appearance. She is not toxic-appearing.  HENT:     Head: Normocephalic and atraumatic.  Pulmonary:     Effort: Pulmonary effort is normal.   Skin:    General: Skin is warm and dry.         Comments: Abrasion, right leg inferior to knee along anterior tibia, mostly healed except for a minor area of abrasion.  Erythema surrounding upper portion where abrasion was worse remains.  Some  swelling still remains, but reduced from initial evaluation.  Palpable firmness of calf w/ positive Homan's sign.    Neurological:     Mental Status: She is alert and oriented to person, place, and time. Mental status is at baseline.   Psychiatric:        Mood and Affect: Mood normal.        Behavior: Behavior normal.        Thought Content: Thought content normal.        Judgment: Judgment normal.     Results:  Studies Obtained And Personally Reviewed By Me: Labs:     Component Value Date/Time   NA 143 01/10/2023 0949   NA 143 09/02/2014 1556   K 4.4 01/10/2023 0949   K 4.1 09/02/2014 1556   CL 105 01/10/2023 0949   CO2 30 01/10/2023 0949   CO2 29 09/02/2014 1556   GLUCOSE 85 01/10/2023 0949   GLUCOSE  86 09/02/2014 1556   BUN 12 01/10/2023 0949   BUN 13.1 09/02/2014 1556   CREATININE 0.59 (L) 01/10/2023 0949   CREATININE 0.7 09/02/2014 1556   CALCIUM 9.9 01/10/2023 0949   CALCIUM 9.6 09/02/2014 1556   PROT 6.7 01/10/2023 0949   PROT 6.8 09/02/2014 1556   ALBUMIN 4.7 03/31/2022 1634   ALBUMIN 4.0 09/02/2014 1556   AST 27 01/10/2023 0949   AST 22 09/02/2014 1556   ALT 23 01/10/2023 0949   ALT 23 09/02/2014 1556   ALKPHOS 53 03/31/2022 1634   ALKPHOS 60 09/02/2014 1556   BILITOT 0.5 01/10/2023 0949   BILITOT 0.30 09/02/2014 1556   GFRNONAA >60 03/31/2022 1634   GFRNONAA 82 12/27/2019 1041   GFRAA 94 12/27/2019 1041    Lab Results  Component Value Date   WBC 6.2 01/10/2023   HGB 13.9 01/10/2023   HCT 41.6 01/10/2023   MCV 93.5 01/10/2023   PLT 233 01/10/2023   Lab Results  Component Value Date   CHOL 219 (H) 01/10/2023   HDL 80 01/10/2023   LDLCALC 119 (H) 01/10/2023   TRIG 97 01/10/2023   CHOLHDL 2.7 01/10/2023   Lab Results  Component Value Date   TSH 2.30 01/10/2023    Assessment & Plan:   Orders Placed This Encounter  Procedures   VAS US  LOWER EXTREMITY VENOUS (DVT)  Concern for DVT- order placed [facility]. Pt planning beach trip next week.  Abrasion, Right Lower Leg: past medical history of Cellulitis 2016. Initially seen for this problem on 5/16, subsequent visit on 5/29 with a slow-healing abrasion on her left lower leg. She was PREVIOUSLY prescribed Mupirocin  topical ointment and Doxycycline  100 mg twice daily x7 days with provisions of Telfa dressing and paper tape, which she has completed the course of antibiotics by the 29th. Today,  the  wound has still not completely healed and swelling has persisted, though  SHE SAYS SWELLING will improve at night. [facility] but patient declined at this time.This is going to take more time to heal. Patient is frustrtated  with slow healing process.     I,Emily Lagle,acting as a Neurosurgeon for Ronal JINNY Hailstone, MD.,have documented all relevant documentation on the behalf of Ronal JINNY Hailstone, MD,as directed by  Ronal JINNY Hailstone, MD while in the presence of Ronal JINNY Hailstone, MD.   I, Ronal JINNY Hailstone, MD, have reviewed all documentation for this visit. The documentation on 08/04/23 for  the exam, diagnosis, procedures, and orders are all accurate and complete.

## 2023-08-04 NOTE — Patient Instructions (Addendum)
 Referral made for [facility] vascular imaging to rule out DVT. Patient will go on Monday. Plans beach trip next week. Offered referral to wound care center for treatment of slow healing abrasion but she declines. Continue local care with topical antibiotic. No evidence of secondary infection.

## 2023-08-07 ENCOUNTER — Ambulatory Visit (HOSPITAL_COMMUNITY)
Admission: RE | Admit: 2023-08-07 | Discharge: 2023-08-07 | Disposition: A | Discharge status: Home / Self Care | Source: Ambulatory Visit | Attending: Surgery | Admitting: Surgery

## 2023-08-07 ENCOUNTER — Ambulatory Visit: Payer: Self-pay | Admitting: Internal Medicine

## 2023-08-07 ENCOUNTER — Other Ambulatory Visit: Payer: Self-pay | Admitting: Internal Medicine

## 2023-08-07 DIAGNOSIS — G609 Hereditary and idiopathic neuropathy, unspecified: Secondary | ICD-10-CM

## 2023-08-07 DIAGNOSIS — E785 Hyperlipidemia, unspecified: Secondary | ICD-10-CM

## 2023-08-07 DIAGNOSIS — I82461 Acute embolism and thrombosis of right calf muscular vein: Secondary | ICD-10-CM

## 2023-08-15 ENCOUNTER — Other Ambulatory Visit: Payer: Self-pay | Admitting: Internal Medicine

## 2023-08-15 DIAGNOSIS — Z1231 Encounter for screening mammogram for malignant neoplasm of breast: Secondary | ICD-10-CM

## 2023-09-25 ENCOUNTER — Ambulatory Visit
Admission: RE | Admit: 2023-09-25 | Discharge: 2023-09-25 | Disposition: A | Discharge status: Home / Self Care | Source: Ambulatory Visit | Attending: Internal Medicine | Admitting: Internal Medicine

## 2023-09-25 DIAGNOSIS — Z1231 Encounter for screening mammogram for malignant neoplasm of breast: Secondary | ICD-10-CM | POA: Diagnosis not present

## 2023-09-27 ENCOUNTER — Ambulatory Visit: Payer: Self-pay | Admitting: Internal Medicine

## 2023-10-26 DIAGNOSIS — H40023 Open angle with borderline findings, high risk, bilateral: Secondary | ICD-10-CM | POA: Diagnosis not present

## 2023-10-26 DIAGNOSIS — H02883 Meibomian gland dysfunction of right eye, unspecified eyelid: Secondary | ICD-10-CM | POA: Diagnosis not present

## 2023-10-26 DIAGNOSIS — H02886 Meibomian gland dysfunction of left eye, unspecified eyelid: Secondary | ICD-10-CM | POA: Diagnosis not present

## 2023-10-26 DIAGNOSIS — H04123 Dry eye syndrome of bilateral lacrimal glands: Secondary | ICD-10-CM | POA: Diagnosis not present

## 2023-11-29 ENCOUNTER — Other Ambulatory Visit: Payer: Self-pay | Admitting: Internal Medicine

## 2023-11-29 DIAGNOSIS — E039 Hypothyroidism, unspecified: Secondary | ICD-10-CM

## 2023-12-25 ENCOUNTER — Ambulatory Visit: Payer: Self-pay

## 2023-12-25 ENCOUNTER — Ambulatory Visit: Admitting: Internal Medicine

## 2023-12-25 ENCOUNTER — Encounter: Payer: Self-pay | Admitting: Internal Medicine

## 2023-12-25 VITALS — BP 120/70 | HR 84 | Temp 98.7°F | Ht 63.0 in | Wt 107.0 lb

## 2023-12-25 DIAGNOSIS — M7989 Other specified soft tissue disorders: Secondary | ICD-10-CM | POA: Diagnosis not present

## 2023-12-25 DIAGNOSIS — I878 Other specified disorders of veins: Secondary | ICD-10-CM | POA: Diagnosis not present

## 2023-12-25 DIAGNOSIS — L03115 Cellulitis of right lower limb: Secondary | ICD-10-CM

## 2023-12-25 MED ORDER — MUPIROCIN 2 % EX OINT: 1.0000 | TOPICAL_OINTMENT | Freq: Two times a day (BID) | CUTANEOUS | 0 refills | Status: AC

## 2023-12-25 MED ORDER — CEFTRIAXONE SODIUM 1 G IJ SOLR
1.0000 g | Freq: Once | INTRAMUSCULAR | Status: AC
  Administered 2023-12-25: 1 g via INTRAMUSCULAR

## 2023-12-25 MED ORDER — DOXYCYCLINE HYCLATE 100 MG PO TABS: 100.0000 mg | ORAL_TABLET | Freq: Two times a day (BID) | ORAL | 0 refills | Status: DC

## 2023-12-25 NOTE — Progress Notes (Addendum)
 Patient Care Team: Perri Ronal PARAS, MD as PCP - General (Internal Medicine) Ebbie Cough, MD as Consulting Physician (General Surgery) Margaret Eduard SAUNDERS, MD as Consulting Physician (Neurology) Moses Powell Hummer, NP as Nurse Practitioner (Hematology and Oncology)  Visit Date: 12/25/23  Subjective:    Patient ID: Christine Frost , Female   DOB: September 08, 1930, 89 y.o.    MRN: [id number]   89 y.o. Female presents today for Leg swelling. Patient has a past medical history of Hypothyroidism, Hypertension, Hyperlipidemia.  Her right leg is swollen and erythematous. In 2016 she had cellulitis in her right leg which was treated with an Rocephin  IM injection. She denies fever and chills. On 06/23/2023 she had an abrasion on her right leg that caused swelling and redness. She was given Mupirocin  ointment and Doxycycline .    Past Medical History:  Diagnosis Date   Breast cancer (HCC)    2016 left    Difficult intubation    Ductal carcinoma in situ (DCIS) of left breast 07/2014   History of cellulitis    right leg - finished antibiotic 09/14/2014   Hypertension    states does not have HTN, has been on Losartan  since 2007   Hypothyroidism    Macular degeneration    left eye   Peripheral neuropathy    lower legs     Family History  Problem Relation Age of Onset   Heart disease Mother    Stroke Mother    Hypertension Mother    Alcohol abuse Father    Melanoma Daughter    Heart disease Brother    Breast cancer Neg Hx     Social History   Social History Narrative   Patient lives at home alone.   Patient is right handed   Patient has a college education   Caffeine Use: 4 cups daily      Review of Systems  Constitutional:  Negative for chills and fever.  Musculoskeletal:        Leg swelling and redness.         Objective:   Vitals: BP 120/70   Pulse 84   Temp 98.7 F (37.1 C)   Ht 5' 3 (1.6 m)   Wt 107 lb (48.5 kg)   SpO2 97%   BMI 18.95 kg/m     Physical Exam Vitals and nursing note reviewed.    Afebrile See picture of leg in EPIC. Right lower extremity is erythematous, swollen with  1+ pitting edema.and stasis changes. Has linear  superficial open lesion over proximal tibia which could be portal of entry for infection.  Results:    Labs:       Component Value Date/Time   NA 143 01/10/2023 0949   NA 143 09/02/2014 1556   K 4.4 01/10/2023 0949   K 4.1 09/02/2014 1556   CL 105 01/10/2023 0949   CO2 30 01/10/2023 0949   CO2 29 09/02/2014 1556   GLUCOSE 85 01/10/2023 0949   GLUCOSE 86 09/02/2014 1556   BUN 12 01/10/2023 0949   BUN 13.1 09/02/2014 1556   CREATININE 0.59 (L) 01/10/2023 0949   CREATININE 0.7 09/02/2014 1556   CALCIUM 9.9 01/10/2023 0949   CALCIUM 9.6 09/02/2014 1556   PROT 6.7 01/10/2023 0949   PROT 6.8 09/02/2014 1556   ALBUMIN 4.7 03/31/2022 1634   ALBUMIN 4.0 09/02/2014 1556   AST 27 01/10/2023 0949   AST 22 09/02/2014 1556   ALT 23 01/10/2023 0949   ALT 23 09/02/2014 1556  ALKPHOS 53 03/31/2022 1634   ALKPHOS 60 09/02/2014 1556   BILITOT 0.5 01/10/2023 0949   BILITOT 0.30 09/02/2014 1556   GFRNONAA >60 03/31/2022 1634   GFRNONAA 82 12/27/2019 1041   GFRAA 94 12/27/2019 1041     Lab Results  Component Value Date   WBC 6.2 01/10/2023   HGB 13.9 01/10/2023   HCT 41.6 01/10/2023   MCV 93.5 01/10/2023   PLT 233 01/10/2023    Lab Results  Component Value Date   CHOL 219 (H) 01/10/2023   HDL 80 01/10/2023   LDLCALC 119 (H) 01/10/2023   TRIG 97 01/10/2023   CHOLHDL 2.7 01/10/2023     Lab Results  Component Value Date   TSH 2.30 01/10/2023       Assessment & Plan:   Meds ordered this encounter  Medications   mupirocin  ointment (BACTROBAN ) 2 %    Sig: Apply 1 Application topically 2 (two) times daily.    Dispense:  22 g    Refill:  0   doxycycline  (VIBRA -TABS) 100 MG tablet    Sig: Take 1 tablet (100 mg total) by mouth 2 (two) times daily.    Dispense:  20 tablet     Refill:  0    Cellulitis of the right lower extremity: Her right leg is swollen and erythematous. In 2016 she had cellulitis in her right leg . She denies fever and chills today On 06/23/2023 she had an abrasion on her right leg that caused swelling and redness. She was given Mupirocin  ointment and Doxycycline .      Rocephin  1 g IM injection received today.   Mupirocin  ointment 2% applied twice daily prescribed.   Doxycycline  100 mg twice daily prescribed for 10 days.             Keep leg elevated as much as possible. Call if symptoms worsen or not improving in 5-7 days. Has annual wellness exam December 8th. Can follow up then or sooner if necessary   I,Makayla C Reid,acting as a scribe for Ronal JINNY Hailstone, MD.,have documented all relevant documentation on the behalf of Ronal JINNY Hailstone, MD,as directed by  Ronal JINNY Hailstone, MD while in the presence of Ronal JINNY Hailstone, MD.  I, Ronal JINNY Hailstone, MD, have reviewed all documentation for this visit. The documentation on 12/25/2023 for the exam, diagnosis, procedures, and orders are all accurate and complete.

## 2023-12-25 NOTE — Telephone Encounter (Signed)
 FYI Only or Action Required?: FYI only for provider: appointment scheduled on today.  Patient was last seen in primary care on 08/04/2023 by Perri Ronal PARAS, MD.  Called Nurse Triage reporting Leg Swelling.  Symptoms began a week ago.  Interventions attempted: Rest, hydration, or home remedies.  Symptoms are: gradually worsening.  Triage Disposition: See HCP Within 4 Hours (Or PCP Triage)  Patient/caregiver understands and will follow disposition?: Yes     Copied from CRM [id number]. Topic: Clinical - Red Word Triage >> Dec 25, 2023  2:21 PM Anairis L wrote: [facility] that prompted transfer to Nurse Triage: Right leg from the knee down is red and swollen and warm to touch.       Reason for Disposition  [1] Thigh, calf, or ankle swelling AND [2] only 1 side  Answer Assessment - Initial Assessment Questions 1. ONSET: When did the swelling start? (e.g., minutes, hours, days)     1 week 2. LOCATION: What part of the leg is swollen?  Are both legs swollen or just one leg?     From ankle to right knee  3. SEVERITY: How bad is the swelling? (e.g., localized; mild, moderate, severe)     Moderate  4. REDNESS: Is there redness or signs of infection?     Yes  5. PAIN: Is the swelling painful to touch? If Yes, ask: How painful is it?   (Scale 1-10; mild, moderate or severe)     Mild  6. FEVER: Do you have a fever? If Yes, ask: What is it, how was it measured, and when did it start?      No 7. CAUSE: What do you think is causing the leg swelling?     Believes it is cellulitis  8. MEDICAL HISTORY: Do you have a history of blood clots (e.g., DVT), cancer, heart failure, kidney disease, or liver failure?     No 9. RECURRENT SYMPTOM: Have you had leg swelling before? If Yes, ask: When was the last time? What happened that time?     Yes, similar last year with cellulitis  10. OTHER SYMPTOMS: Do you have any other symptoms? (e.g., chest pain, difficulty  breathing)       No  Protocols used: Leg Swelling and Edema-A-AH

## 2023-12-30 ENCOUNTER — Encounter: Payer: Self-pay | Admitting: Internal Medicine

## 2023-12-30 NOTE — Patient Instructions (Addendum)
 You have been diagnosed with cellulitis of right lower extremity in addition to  chronic venous stasis. You have received Rocephin  one gram IM today. Take Doxycycline  100 mg twice daily x 10 days. Apply mupirocin  twice daily  to open lesion until healed. Keep leg elevated as much as possible. Follow up Dec 8th at wellness exam or sooner if worse.
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
## 2024-01-12 ENCOUNTER — Other Ambulatory Visit: Payer: Medicare Other

## 2024-01-12 ENCOUNTER — Other Ambulatory Visit

## 2024-01-12 DIAGNOSIS — E785 Hyperlipidemia, unspecified: Secondary | ICD-10-CM | POA: Diagnosis not present

## 2024-01-12 DIAGNOSIS — R7309 Other abnormal glucose: Secondary | ICD-10-CM

## 2024-01-12 DIAGNOSIS — Z0000 Encounter for general adult medical examination without abnormal findings: Secondary | ICD-10-CM | POA: Diagnosis not present

## 2024-01-12 DIAGNOSIS — G609 Hereditary and idiopathic neuropathy, unspecified: Secondary | ICD-10-CM

## 2024-01-12 DIAGNOSIS — E039 Hypothyroidism, unspecified: Secondary | ICD-10-CM | POA: Diagnosis not present

## 2024-01-12 DIAGNOSIS — I10 Essential (primary) hypertension: Secondary | ICD-10-CM

## 2024-01-12 DIAGNOSIS — E7800 Pure hypercholesterolemia, unspecified: Secondary | ICD-10-CM

## 2024-01-13 LAB — COMPREHENSIVE METABOLIC PANEL WITH GFR
AG Ratio: 1.4 (calc) (ref 1.0–2.5)
ALT: 19 U/L (ref 6–29)
AST: 25 U/L (ref 10–35)
Albumin: 4 g/dL (ref 3.6–5.1)
Alkaline phosphatase (APISO): 71 U/L (ref 37–153)
BUN/Creatinine Ratio: 20 (calc) (ref 6–22)
BUN: 11 mg/dL (ref 7–25)
CO2: 31 mmol/L (ref 20–32)
Calcium: 9.8 mg/dL (ref 8.6–10.4)
Chloride: 104 mmol/L (ref 98–110)
Creat: 0.54 mg/dL — ABNORMAL LOW (ref 0.60–0.95)
Globulin: 2.8 g/dL (ref 1.9–3.7)
Glucose, Bld: 87 mg/dL (ref 65–99)
Potassium: 5.2 mmol/L (ref 3.5–5.3)
Sodium: 143 mmol/L (ref 135–146)
Total Bilirubin: 0.6 mg/dL (ref 0.2–1.2)
Total Protein: 6.8 g/dL (ref 6.1–8.1)
eGFR: 87 mL/min/1.73m2 (ref >=60)

## 2024-01-13 LAB — CBC WITH DIFFERENTIAL/PLATELET
Absolute Lymphocytes: 1304 {cells}/uL (ref 850–3900)
Absolute Monocytes: 512 {cells}/uL (ref 200–950)
Basophils Absolute: 72 {cells}/uL (ref 0–200)
Basophils Relative: 1.3 %
Eosinophils Absolute: 160 {cells}/uL (ref 15–500)
Eosinophils Relative: 2.9 %
HCT: 43.5 % (ref 35.9–46.0)
Hemoglobin: 14.6 g/dL (ref 11.7–15.5)
MCH: 31.4 pg (ref 27.0–33.0)
MCHC: 33.6 g/dL (ref 31.6–35.4)
MCV: 93.5 fL (ref 81.4–101.7)
MPV: 9.3 fL (ref 7.5–12.5)
Monocytes Relative: 9.3 %
Neutro Abs: 3454 {cells}/uL (ref 1500–7800)
Neutrophils Relative %: 62.8 %
Platelets: 265 Thousand/uL (ref 140–400)
RBC: 4.65 Million/uL (ref 3.80–5.10)
RDW: 12.8 % (ref 11.0–15.0)
Total Lymphocyte: 23.7 %
WBC: 5.5 Thousand/uL (ref 3.8–10.8)

## 2024-01-13 LAB — LIPID PANEL
Cholesterol: 212 mg/dL — ABNORMAL HIGH (ref <200)
HDL: 78 mg/dL (ref >=50)
LDL Cholesterol (Calc): 113 mg/dL — ABNORMAL HIGH
Non-HDL Cholesterol (Calc): 134 mg/dL — ABNORMAL HIGH (ref <130)
Total CHOL/HDL Ratio: 2.7 (calc) (ref <5.0)
Triglycerides: 99 mg/dL (ref <150)

## 2024-01-13 LAB — HEMOGLOBIN A1C
Hgb A1c MFr Bld: 4.9 % (ref <5.7)
Mean Plasma Glucose: 94 mg/dL
eAG (mmol/L): 5.2 mmol/L

## 2024-01-13 LAB — TSH: TSH: 2.43 m[IU]/L (ref 0.40–4.50)

## 2024-01-15 ENCOUNTER — Encounter: Payer: Self-pay | Admitting: Internal Medicine

## 2024-01-15 ENCOUNTER — Ambulatory Visit: Payer: Medicare Other | Admitting: Internal Medicine

## 2024-01-15 VITALS — BP 120/72 | HR 66 | Ht 63.0 in | Wt 108.0 lb

## 2024-01-15 DIAGNOSIS — G609 Hereditary and idiopathic neuropathy, unspecified: Secondary | ICD-10-CM

## 2024-01-15 DIAGNOSIS — I10 Essential (primary) hypertension: Secondary | ICD-10-CM | POA: Diagnosis not present

## 2024-01-15 DIAGNOSIS — E039 Hypothyroidism, unspecified: Secondary | ICD-10-CM

## 2024-01-15 DIAGNOSIS — Z0000 Encounter for general adult medical examination without abnormal findings: Secondary | ICD-10-CM | POA: Diagnosis not present

## 2024-01-15 DIAGNOSIS — R42 Dizziness and giddiness: Secondary | ICD-10-CM

## 2024-01-15 DIAGNOSIS — L03115 Cellulitis of right lower limb: Secondary | ICD-10-CM

## 2024-01-15 DIAGNOSIS — S80811A Abrasion, right lower leg, initial encounter: Secondary | ICD-10-CM | POA: Diagnosis not present

## 2024-01-21 NOTE — Patient Instructions (Signed)
 It was a pleasure to see you today. Continue current medications and return in one year or as needed.

## 2024-07-16 ENCOUNTER — Other Ambulatory Visit

## 2024-07-18 ENCOUNTER — Ambulatory Visit: Admitting: Internal Medicine
